# Patient Record
Sex: Female | Born: 1950 | Race: White | Hispanic: No | Marital: Married | Smoking: Never smoker
Health system: Southern US, Community
[De-identification: ages and names within clinical notes are randomized; demographics above are authoritative.]

## PROBLEM LIST (undated history)

## (undated) DIAGNOSIS — F329 Major depressive disorder, single episode, unspecified: Secondary | ICD-10-CM

## (undated) DIAGNOSIS — F32A Depression, unspecified: Secondary | ICD-10-CM

## (undated) DIAGNOSIS — I10 Essential (primary) hypertension: Secondary | ICD-10-CM

## (undated) DIAGNOSIS — N644 Mastodynia: Secondary | ICD-10-CM

## (undated) DIAGNOSIS — K5900 Constipation, unspecified: Secondary | ICD-10-CM

## (undated) DIAGNOSIS — Z1231 Encounter for screening mammogram for malignant neoplasm of breast: Secondary | ICD-10-CM

## (undated) DIAGNOSIS — M19079 Primary osteoarthritis, unspecified ankle and foot: Secondary | ICD-10-CM

## (undated) DIAGNOSIS — Z01818 Encounter for other preprocedural examination: Secondary | ICD-10-CM

## (undated) LAB — EKG 12-LEAD
Atrial Rate: 78 {beats}/min
P Axis: 62 degrees
P-R Interval: 164 ms
Q-T Interval: 392 ms
QRS Duration: 88 ms
QTc Calculation (Bazett): 446 ms
R Axis: 0 degrees
T Axis: 49 degrees
Ventricular Rate: 78 {beats}/min

---

## 2009-02-02 NOTE — Progress Notes (Signed)
1. Left Ankle Arthritis (716.97E)  XR ANKLE LEFT AP LATERAL AND OBLIQUE, PR DRAIN/INJECT INTERMEDIATE JOINT/BURSA, triamcinolone acetonide (KENALOG) 40 MG/ML injection, lidocaine (XYLOCAINE) 1 % (PF) SOLN injection       Note dictated

## 2009-02-10 NOTE — Progress Notes (Signed)
CHIEF COMPLAINT/DIAGNOSIS:  Left ankle pain/posttraumatic ankle arthritis.     HISTORY OF PRESENT ILLNESS:  This is a 58 year old white female, otherwise  fairly healthy and active, who has been complaining of left ankle pain for at  least 5-6 years.  She does not have any specific history of ankle fracture.   She has multiple sprains in the past and when she was young she was doing  skydiving and she sprained her left ankle multiple times.  She saw   Dr. Andrena Mews in the early 1990s where she had apparently surgery on her  posterior tibialis tendon where she remembered that she has a tendon  reattachment.  She had been doing fairly well until recently when she saw Dr.  Andrena Mews and he told her that she has an end-stage ankle arthritis and he  recommended ankle fusion when the pain gets worse.  She did receive cortisone  injection in the past by her rheumatologist, but she has not had cortisone  injections in a while.  She describes the pain as an achy pain that can be  sharp at times and mainly on the anterior and medial aspect of the left  ankle.  The pain increases with activity and decreases with rest.  She would  like to be more active and she is here today for evaluation and  recommendation.  She is the niece of Mr Doy Mince who I treated him for a  trimalleolar fracture and dislocation 2 months ago.     The Past Medical History was reviewed and initialized, and that should  include the Social History and Family History and Review of Systems, as well.    PHYSICAL EXAMINATION:  This is a very pleasant 58 year old white female who  presents today in no acute distress, who is awake, alert, and oriented.  The  patient is well dressed, nourished, and groomed.  The patient with normal  affect.  Height is 5 feet 10 and weight is 175.  Resting respiratory rate is  16.       Examination of gait showing the patient walks heel-to-toe with a nonantalgic  gait and no significant limp.  Examination of stance showing a  well-maintained  arch bilaterally.  There is a mild swelling of the left ankle  compared to the right side.  Examination of range of motion showing decreased  left ankle range of motion compared to the right side, but she still has a  decent range of motion.  She was able to dorsiflex to about 5 degrees past  neutral on the left side compared to about 10-15 on the right side.  She has  about 40 degrees of total arch of motion on the left side.  She has a  reasonable subtalar motion, equal bilaterally.  She has a good strength in 4  planes and she has intact sensation.  The ankle is stable to drawer test.      X-RAYS:  X-rays were taken in the office today, 3 views of the left ankle  standing, and that showed a bone-on-bone arthritis of the left ankle.  She  has a large anterior osteophyte that seems to be detached from the distal  tibia.  She also has another osteophyte on the posterior aspect dorsal to the  os trigonum.  No acute fractures can be seen.  She has some subchondral  changes that can be seen in the medial malleolus.       IMPRESSION:  The patient has a left ankle  end-stage ankle arthritis.  She  still has a reasonable range of motion.  She is symptomatic.       PLAN:  I had a long discussion with the patient and her husband about  treatment options about this patient.  She has not had any steroid injections  in a while.  I did recommend that the patient receive a cortisone injection,  which she agrees to have.     PROCEDURE:  Under sterile condition, the patient received a cortisone  injection in the form of Kenalog 1 mL of 40 mg mixed with 3 mL of 1%  lidocaine.  The patient tolerated the procedure well.       The patient did report significant improvement immediately after the  injection.     I did discuss with the patient at length about surgical options which  included ankle fusion versus replacement.  I did discuss with the patient the  pros and cons of each.  The patient will think about it and then she will  come  back for followup in 2 months.        [facility] Revonda Humphrey, MD     SMA /[id number]  DD: 02/02/2009 17:45  DT: 02/02/2009 18:11  SSI File#: [id number]  Job #: [id number]

## 2009-04-13 NOTE — Telephone Encounter (Signed)
Dr.Arebi please advise.

## 2009-04-13 NOTE — Telephone Encounter (Signed)
Pt wanting to discuss getting ankle replacement   Pt missed appt today but would just like to discuss her options for surg- pls call thx

## 2009-07-14 NOTE — Telephone Encounter (Signed)
Pt could not have ankle surg done in [place] because insurance wouldn't approve-so pt thought if came back here if Dr Su Hoff would see he-pls call her she has few questions-pls call

## 2009-07-14 NOTE — Telephone Encounter (Signed)
Called patient and told her that I will discuss her questions and concerns with Dr. Su Hoff in the office tomorrow.

## 2009-07-15 NOTE — Telephone Encounter (Signed)
Patient needs a letter saying that you have seen her in the office and that you think that she would benefit greatly from having ankle surgery.

## 2009-07-19 NOTE — Telephone Encounter (Signed)
Pt called to check on the letter and really wants it faxed.  I told her this may not be possible.  You may want to touch base with her on this matter.

## 2009-07-19 NOTE — Telephone Encounter (Signed)
I talked to her and we are going to see if they will use his last OV note.

## 2009-10-11 NOTE — Telephone Encounter (Signed)
Called and spoke with husband and he is calling back with a phone number for a person I need to call.

## 2009-10-11 NOTE — Telephone Encounter (Signed)
Chip Pangborn calling Husband of Rula Keniston.      [employer] Rep for [employer]. [phone]      Chip- [phone]

## 2009-10-11 NOTE — Telephone Encounter (Signed)
Pt's husband calling and would like to speak with Verlon Au regarding a specialist in [place] the pt was referred to -pls call

## 2009-10-12 NOTE — Telephone Encounter (Signed)
Dr. Su Hoff is going to speak with Dr. Izell Carolina about this.

## 2010-01-16 NOTE — Telephone Encounter (Signed)
fyi-per Italychad peterson (works w/ Dr. Izell Carolinaonti) if SMA has any questions can email. Pt states that SMA studied w/ Dr. Izell Carolinaonti and is having surgery w/ this MD, however she is follow up w/ SMA.

## 2010-01-17 NOTE — Telephone Encounter (Signed)
SMA notified and said ok

## 2010-02-08 NOTE — Progress Notes (Signed)
Dr. Su Hoff ordered a short-leg cast  to be applied to Brooke Anderson's left lower leg and foot .  I applied a short-leg cast cast to Brooke Anderson's left lower leg and foot.  I applied 3 rolls of under padding in a 50/50 fashion.  Guy Begin requested light blue color fiberglass.  I rolled 4 rolls of fiberglass in a 50/50 fashion.  Her left lower ankle is in a position of function per Dr. Su Hoff.  Brooke Anderson's nail beds are pink in color, the extremity is warm to the touch.  Capillary refill is less than 2 seconds.  Guy Begin instructed on proper care of cast.  Do not get wet, keep all items out of cast.  If cast is painful please make appointment to get checked.  Patient also briefed on circulation compromise.  If digits are cold, blue, and tingling patient must must seek care.  If after hours patient is to go to Emergency Room.  During office hours patient must come in to office. \\

## 2010-02-08 NOTE — Progress Notes (Signed)
1. Total left ankle replacement      Dr Izell Carolina, 01/25/2010   2. Left Ankle Arthritis  XR ANKLE LEFT AP LATERAL AND OBLIQUE     Note dictated

## 2010-02-10 NOTE — Progress Notes (Signed)
DIAGNOSIS:  Status post left total ankle replacement by Dr. Izell Carolina in  [place].  Date of surgery is Jan 25, 2010.       HISTORY OF PRESENT ILLNESS:  This is a 59 year old white female who I saw a  year ago for right ankle posttraumatic arthritis and she went to have a total  ankle replacement in [place] by Dr. Izell Carolina.  She is here today for a 2-week  followup.  She did stay for 1 week in [place] and she was seen  postoperatively in 1 week for a wound check.  At that time she was placed in  a cast and asked to put about 30 pounds of weight 2 to 3 times a day to keep  the tibial component in place.  At this point she denies any significant  pain.  No numbness or tingling sensation and she is in a cast.  She denies  any fever or chills.  Patient is otherwise fairly healthy and active.       PHYSICAL EXAMINATION:  This is a very pleasant 59 year old white female who  was seen today in no acute distress, who is awake, alert, and oriented.   Patient well dressed, nourished, and groomed.  Patient with normal affect.   Height is 5 feet 10 inches and weight 175 pounds.  The resting respiratory  rate is 18 and pulse is 82.       The patient came in nonweightbearing on the left lower extremity with  crutches and a cast.  The cast was removed.  She has an anterior incision for  the ankle replacement which is healing well. No signs of any erythema or  drainage.  Also the lateral incision is healing well as well.  No pain with  limited range of motion of the left ankle.  She has intact sensation  distally.       IMAGING:  X-rays were taken in the office today, nonweightbearing of the left  ankle, 3 views that showed the ankle replacement in place.  Minimal gap less  than 1 mm can be seen over the medial side of the tibia but overall the  alignment is good.       IMPRESSION:  Two weeks out from left total ankle replacement doing fairly  well.       PLAN:  Patient will be placed back in cast with still nonweightbearing  but  she will put about 30 pounds of weight 2 to 3 times a day to keep pressure on  the tibial component.  I did contact  Italy Peterson, PAC,  in [place], the  [place] of Dr. Izell Carolina and the plan is to keep her in a cast for 4 weeks and after  that we will start gradual weightbearing after 6 to 8 weeks in a boot.  She  will come back in 2 weeks.  At that time we will remove the cast with the new  x-rays.             [facility] Revonda Humphrey, MD     [phone]  DD: 02/10/2010 04:47  DT: 02/12/2010 17:48  SSI File#: [id number]  Job #: [id number]

## 2010-02-22 NOTE — Progress Notes (Signed)
DIAGNOSIS:  Status post left total ankle replacement by Dr. Izell Carolina in [place].      Date of surgery: 5/11/ 2011.       HISTORY OF PRESENT ILLNESS:  This is a 59 year old white female came in today for f/u of her left total ankle replacement. I saw a year ago for left ankle posttraumatic arthritis and she went to have a total ankle replacement in [facility] by Dr. Izell Carolina.  She is here today for a 4-week followup.  She did stay for 1 week in [place] and she was seen postoperatively in 1 week for a wound check.  At that time she was placed in a cast and asked to put about 30 pounds of weight 2 to 3 times a day to keep the tibial component in place.  At this point she denies any significant pain.  No numbness or tingling sensation and she is in a cast.  She denies any fever or chills.  Patient is otherwise fairly healthy and active.       PHYSICAL EXAMINATION:  This is a very pleasant 59 year old white female who was seen today in no acute distress, who is awake, alert, and oriented.  Patient well dressed, nourished, and groomed.  Patient with normal affect.  Height is 5 feet 10 inches and weight 175 pounds.  The resting respiratory rate is 18 and pulse is 82.       The patient came in nonweightbearing on the left lower extremity with crutches and a cast.  The cast was removed.  She has an anterior incision for the ankle replacement which is healing well. No signs of any erythema or drainage.  Also the lateral incision is healing well as well.  No pain with limited range of motion of the left ankle.  She has intact sensation distally.       IMAGING:  X-rays were taken in the office today, nonweightbearing of the left ankle, 3 views that showed the ankle replacement in place.  Minimal gap less than 1 mm can be seen over the medial side of the tibia but overall the alignment is good.       IMPRESSION:  Four weeks out from left total ankle replacement doing fairly well.       PLAN:  Patient will be placed back in cast  for 2 more weeks with still nonweightbearing but she will put about 30 pounds of weight 2 to 3 times a day to keep pressure on the tibial component. After that we will start gradual weightbearing in a boot for 6 to 8 weeks .  She  will come back in 2 weeks.  At that time we will remove the cast with the new x-rays.             Brooke Anderson Brooke Humphrey, MD

## 2010-03-08 NOTE — Progress Notes (Signed)
DIAGNOSIS:  Status post left total ankle replacement by Dr. Izell Carolina in [place].      Date of surgery: 5/11/ 2011.       HISTORY OF PRESENT ILLNESS:  This is a 59 year old white female came in today for f/u of her left total ankle replacement. I saw her a year ago for left ankle posttraumatic arthritis and she went to have a total ankle replacement in [facility] by Dr. Izell Carolina.  She is here today for a 6-week followup.  She did stay for 1 week in [place] where she was seen postoperatively in 1 week for a wound check.  At that time she was placed in a cast and asked to put about 30 pounds of weight 2 to 3 times a day to keep the tibial component in place.  At this point she denies any significant pain.  No numbness or tingling sensation and she is in a cast.  She denies any fever or chills.  Patient is otherwise fairly healthy and active.       PHYSICAL EXAMINATION:  This is a very pleasant 59 year old white female who was seen today in no acute distress, who is awake, alert, and oriented.  Patient well dressed, nourished, and groomed.  Patient with normal affect.  Height is 5 feet 10 inches and weight 175 pounds.  The resting respiratory rate is 18 and pulse is 82.       The patient came in nonweightbearing on the left lower extremity with crutches and a cast.  The cast was removed.  The anterior and the lateral incisions for the ankle replacement are healed well. No signs of any erythema or drainage. No pain with limited range of motion of the left ankle.  She has intact sensation distally. No tenderness.      IMAGING:  X-rays were taken in the office today, nonweightbearing of the left ankle, 3 views that showed the ankle replacement in place.  The syndesmosis fusion still visible at the fibula side.       IMPRESSION:  Six weeks out from left total ankle replacement doing fairly well.       PLAN:  Patient will be placed in a boot and start gradual weightbearing. I discussed with the patient that I think that she  would really benefit from a course of physical therapy for further strengthening and stretching. An Rx for physical therapy was given to the patient.  She will come back in 4 weeks.  At that time we will get new x-rays standing.             Chelsi Warr Revonda Humphrey, MD

## 2010-03-15 NOTE — Telephone Encounter (Signed)
Spoke with [facility] and told her to start gradual WB in boot and everything else is per patients pain tolerance.

## 2010-03-15 NOTE — Telephone Encounter (Signed)
Yelema calling from [facility] to find out what pt's limitations/precautions are since she had ankle replacement. Pls call

## 2010-03-17 NOTE — Telephone Encounter (Signed)
Spoke with patient and told her vitamin d would work and to avoid sunlight with that.

## 2010-03-17 NOTE — Telephone Encounter (Signed)
Pt wanting to know what is the name of the ointment that you can apply to the scare that helps it go away.pls call pt

## 2010-04-06 NOTE — Progress Notes (Signed)
DIAGNOSIS:  Status post left total ankle replacement by Dr. Izell Carolina in [place].      Date of surgery: 01/25/2010.       HISTORY OF PRESENT ILLNESS:  This is a 59 year old white female came in today for f/u of her left total ankle replacement. She is here today for a 10 weeks followup, and WBAT in a boot and also in PT with good progress. I saw her a year ago for left ankle posttraumatic arthritis and she went to have a total ankle replacement in [facility] by Dr. Izell Carolina. She did stay for 1 week in [place] where she was seen postoperatively in 1 week for a wound check.  At that time she was placed in a cast and asked to put about 30 pounds of weight 2 to 3 times a day to keep the tibial component in place.  At this point she denies any significant pain.  No numbness or tingling sensation and she is in a boot.  She denies any fever or chills.  Patient is otherwise fairly healthy and active.       PHYSICAL EXAMINATION:  This is a very pleasant 59 year old white female who was seen today in no acute distress, who is awake, alert, and oriented.  Patient well dressed, nourished, and groomed.  Patient with normal affect.  Height is 5 feet 10 inches and weight 175 pounds.  The resting respiratory rate is 18 and pulse is 82.       The patient came in nonweightbearing on the left lower extremity with crutches and a cast.  The cast was removed.  The anterior and the lateral incisions for the ankle replacement are healed well. No signs of any erythema or drainage. No pain with limited range of motion of the left ankle.  She has intact sensation distally. No tenderness.      IMAGING:  X-rays were taken in the office today, nonweightbearing of the left ankle, 3 views that showed the ankle replacement in place.  The syndesmosis fusion less visible at the fibula side.       IMPRESSION:  10 weeks out from left total ankle replacement doing fairly well.       PLAN:  Patient will be WBAT in boot for 3 more weeks, then out boot. I  discussed with the patient that I think that she would really benefit from more physical therapy for further strengthening, ROM and stretching.   She will come back in 4 weeks.  At that time we will get new x-rays standing.             Devondre Guzzetta Revonda Humphrey, MD

## 2010-04-06 NOTE — Progress Notes (Signed)
Patient presents today for recheck of her left total ankle repair.  Patient reports that she is doing much better, slowly increasing her ROM.  Patients pain level is 1/10.  Patient ROM is reported as good.

## 2010-05-03 NOTE — Progress Notes (Signed)
DIAGNOSIS:  Status post left total ankle replacement by Dr. Izell Carolina in [place].      Date of surgery: 01/25/2010.       HISTORY OF PRESENT ILLNESS:  This is a 59 year old white female came in today for f/u of her left total ankle replacement. She is here today for a 3 months followup, and WBAT in regular shoes and also in PT with good progress. She went to have a total ankle replacement in [place] by Dr. Izell Carolina and spent 1 week in [place] where she was seen postoperatively in 1 week for a wound check.  At that time she was placed in a cast and asked to put about 30 pounds of weight 2 to 3 times a day to keep the tibial component in place.  At this point she denies any significant pain.  No numbness or tingling sensation and she is in a boot.  She denies any fever or chills.  Patient is otherwise fairly healthy and active.      The patient's past medical history, medications, and review of systems was reviewed.        PHYSICAL EXAMINATION:  This is a very pleasant 59 year old white female who was seen today in no acute distress, who is awake, alert, and oriented.  Patient well dressed, nourished, and groomed.  Patient with normal affect.  Height is 5 feet 10 inches and weight 175 pounds.  The resting respiratory rate is 18 and pulse is 82.       The patient came in full weightbearing on the left lower extremity, with minimal limp. The anterior and the lateral incisions for the ankle replacement are healed well. No signs of any erythema or drainage. No pain with limited range of motion of the left ankle.  She has intact sensation distally. No tenderness. DF -8, PF 35     IMAGING:  X-rays were taken in the office today, standing of the left ankle, 3 views that showed the ankle replacement in place.  The syndesmosis fusion healing well, no hard ware faliure.     IMPRESSION:  3 months out from left total ankle replacement doing fairly well.       PLAN:  Patient will continue to be WBAT. I discussed with the patient  that I think that she would really benefit from more physical therapy for further strengthening, ROM and stretching.   She will come back in 2 months.  At that time we will get new x-rays standing.             Regnia Mathwig Revonda Humphrey, MD

## 2010-07-14 NOTE — Progress Notes (Signed)
DIAGNOSIS:  Status post left total ankle replacement by Dr. Izell Carolina in [place].      Date of surgery: 01/25/2010.       HISTORY OF PRESENT ILLNESS:  This is a 59 year old white female came in today for f/u of her left total ankle replacement. She is here today for a 6 months followup, and WBAT in regular shoes. She finished PT with good progress. She is very happy with the results and has no pain.  She went to have a total ankle replacement in [place] by Dr. Izell Carolina and spent 1 week in [place] where she was seen postoperatively in 1 week for a wound check.  At that time she was placed in a cast and asked to put about 30 pounds of weight 2 to 3 times a day to keep the tibial component in place.  At this point she denies any pain. No numbness or tingling sensation. She denies any fever or chills. Patient is otherwise fairly healthy and active.      The patient's past medical history, medications, and review of systems was reviewed.        PHYSICAL EXAMINATION:  This is a very pleasant 59 year old white female who was seen today in no acute distress, who is awake, alert, and oriented.  Patient well dressed, nourished, and groomed.  Patient with normal affect.  Height is 5 feet 10 inches and weight 175 pounds.  The resting respiratory rate is 18 and pulse is 82.       The patient came in full weightbearing on the left lower extremity, with no limp. The anterior and the lateral incisions for the ankle replacement are healed well. No signs of any erythema or drainage. No pain with good range of motion of the left ankle.  She has intact sensation distally. No tenderness. DF 5, PF 45     IMAGING:  X-rays were taken in the office today, standing of the left ankle, 3 views that showed the ankle replacement in place.  The syndesmosis fusion healing well, no hard ware faliure.     IMPRESSION:  6 months out from left total ankle replacement doing fairly well.       PLAN:  Patient will continue to be WBAT. I discussed with  the patient that I think that she would really benefit from more strengthening, ROM and stretching.   She will come back in 6 months.  At that time we will get new x-rays standing.             Conita Amenta Revonda Humphrey, MD

## 2011-08-10 ENCOUNTER — Encounter

## 2011-08-14 LAB — TSH
T4 Free: 1.11 ng/dl (ref 0.9–1.8)
TSH: 2.64 u[IU]/mL (ref 0.35–5.5)

## 2011-08-14 LAB — T3, FREE: T3, Free: 2.9 pg/ml (ref 2.3–4.2)

## 2011-08-14 LAB — T4, FREE: T4 Free: 1.11 ng/dl (ref 0.9–1.8)

## 2011-08-14 NOTE — Patient Instructions (Signed)
1. Do not eat or drink anything after 12 midnight prior to surgery. This includes no water, chewing gum mints, or ice chips. You may brush your teeth and gargle the day of surgery but DO NOT SWALLOW THE WATER.   2. Please see your family doctor/pediatrician for a history and physical and/or concerning medications. Bring any test results/reports from your physician's office. If you are under the care of a heart doctor or specialist please be aware that you may be asked to see him or her for clearance.   3. You may be asked to stop blood thinners such as Coumadin, Plavix, Fragmin, and Lovenox or Anti-inflammatories such as Aspirin, Ibuprofen, Advil, and Naproxen prior to your surgery. Please check with your doctor before stopping these or any other medications.   4. Do not smoke, and do not drink any alcoholic beverages 24 hours prior to surgery.    5. You MUST make arrangements for a responsible adult to take you home after your surgery. For your safety, you will not be allowed to leave alone or drive yourself home. Your surgery will be cancelled if you do not have a ride home.Also for your safety, it is strongly suggested someone stay with you the first 24 hrs after your surgery.   6. A parent/legal guardian must accompany a child scheduled for surgery and plan to stay at the hospital until the child is discharged.  Please do not bring other children with you.   7. For your comfort,please wear simple, loose fitting clothing to the hospital.  Please do not bring valuables (money, credit cards, checkbooks, etc.) Do not wear any makeup (including no eye makeup) or nail polish on your fingers or toes.   8. For your safety, please DO NOT wear any jewelry or piercings on day of surgery.  All body piercing jewelry must be removed.   9. If you have dentures, they will be removed before going to the OR; for your convenience we will provide you with a container.  If you wear contact lenses or glasses, they will be removed,  they will be removed, please bring a case for them.   10. If appicable,Please see your family doctor/pediatrician for a history & physical and/or concerning medications.  Bring any test results/reports from your physician's office.   11. Remember to bring Blood Bank bracelet to the hospital on the day of surgery.   12. If you have a Living Will and Durable Power of Attorney for Healthcare, please bring in a copy.   13. Notify your Surgeon if you develop any illness between now and surgery  time, cough, cold, fever, sore throat, nausea, vomiting, etc.  Please notify your surgeon if you experience dizziness, shortness of breath or blurred vision between now & the time of your surgery   14. DO NOT shave your operative site 96 hours prior to surgery. For face & neck surgery, men may use an electric razor 48 hours prior to surgery.   15. Shower the night before surgery with __X_Antibacterial soap ___Hibiclens   16. To provide excellent care visitors will be limited to one in the room at any given time.               17.  Please bring picture ID and insurance card.    18.  Visit our web site for additional information:  [email or web address].

## 2011-08-16 LAB — CBC WITH AUTO DIFFERENTIAL
Basophils %: 0.4 % (ref 0.0–2.0)
Basophils Absolute: 0 10*3 (ref 0.0–0.2)
Eosinophils %: 2.2 % (ref 0.0–5.0)
Eosinophils Absolute: 0.1 10*3 (ref 0.0–0.6)
Granulocyte Absolute Count: 4.6 10*3 (ref 1.7–7.7)
Hematocrit: 37.6 % (ref 36.0–48.0)
Hemoglobin: 12.6 gm/dl (ref 12.0–16.0)
Lymphocytes %: 18.1 % — ABNORMAL LOW (ref 25.0–40.0)
Lymphocytes Absolute: 1.2 10*3 (ref 1.0–5.1)
MCH: 30.3 pg (ref 26–34)
MCHC: 33.5 gm/dl (ref 31–36)
MCV: 90.6 fl (ref 80–100)
MPV: 7.5 fl (ref 5.0–10.5)
Monocytes %: 7.3 % (ref 0.0–12.0)
Monocytes Absolute: 0.5 10*3 (ref 0.0–1.3)
Platelets: 322 10*3 (ref 135–450)
RBC: 4.15 10*6 (ref 4.0–5.2)
RDW: 14.6 % — ABNORMAL HIGH (ref 11.5–14.5)
Segs Relative: 72 % — ABNORMAL HIGH (ref 42.0–63.0)
WBC: 6.4 10*3 (ref 4.0–11.0)

## 2011-08-16 LAB — BASIC METABOLIC PANEL
BUN: 22 mg/dl — ABNORMAL HIGH (ref 7–18)
CO2: 28 mEq/L (ref 21–32)
Calcium: 9.1 mg/dl (ref 8.3–10.6)
Chloride: 102 mEq/L (ref 99–110)
Creatinine: 0.9 mg/dl (ref 0.6–1.2)
GFR Est, African/Amer: >60
GFR, Estimated: >60 (ref >60)
Glucose: 78 mg/dl (ref 70–99)
Potassium: 4.1 mEq/L (ref 3.5–5.1)
Sodium: 138 mEq/L (ref 136–145)

## 2011-08-16 LAB — URINALYSIS
Bilirubin Urine: NEGATIVE
Glucose, Ur: NEGATIVE mg/dl
Ketones, Urine: NEGATIVE mg/dl
Leukocyte Esterase, Urine: NEGATIVE
Nitrite, Urine: NEGATIVE
Protein, UA: NEGATIVE mg/dl
Specific Gravity, UA: 1.02 (ref 1.005–1.030)
Urobilinogen, Urine: 0.2 EU/dl (ref <2.0)
pH, UA: 6 (ref 4.5–8.0)

## 2011-08-16 LAB — PROTIME-INR
INR: 0.89
Protime: 10.2 s

## 2011-08-16 LAB — TYPE AND SCREEN
Antibody Screen: NEGATIVE
Rh Factor: NEGATIVE

## 2011-08-16 LAB — APTT: aPTT: 29.5 s

## 2011-08-25 NOTE — Anesthesia Pre-Procedure Evaluation (Signed)
Guy Begin     Anesthesia Evaluation      No history of anesthetic complications   Airway   Mallampati: II  TM distance: >3 FB  Neck ROM: full  Dental      Pulmonary - negative ROS   Cardiovascular   (+) hypertension,     Neuro/Psych    (+) psychiatric history  GI/Hepatic/Renal - negative ROS     Endo/Other  (+) , arthritis  Abdominal        Other findings: Limited mouth opening           Allergies: Review of patient's allergies indicates no known allergies.    NPO Status:                              Anesthesia Plan    ASA 2     general   (I discussed inhalational and intravenous endotracheal anesthesia with ISNB patient to Her  satisfaction and agreed including risks and alternatives. No further questions.    Virdie Penning S Brannen Koppen  )  intravenous and inhalational induction   Anesthetic plan and risks discussed with patient.    Plan discussed with CRNA.          Petro Talent S Joshuajames Moehring  08/25/2011

## 2011-08-27 ENCOUNTER — Inpatient Hospital Stay
Admit: 2011-08-27 | Disposition: A | Discharge status: Home / Self Care | Source: Ambulatory Visit | Attending: Orthopaedic Surgery | Admitting: Orthopaedic Surgery

## 2011-08-27 MED ORDER — PROMETHAZINE HCL 25 MG/ML IJ SOLN
25 | INTRAMUSCULAR | Status: DC | PRN
Start: 2011-08-27 — End: 2011-08-27

## 2011-08-27 MED ORDER — HYDROMORPHONE HCL 2 MG/ML IJ SOLN
2 | INTRAMUSCULAR | Status: DC | PRN
Start: 2011-08-27 — End: 2011-08-27

## 2011-08-27 MED ORDER — MIDAZOLAM HCL 2 MG/2ML IJ SOLN
2 | Freq: Once | INTRAMUSCULAR | Status: AC
Start: 2011-08-27 — End: 2011-08-27

## 2011-08-27 MED ORDER — OXYCODONE-ACETAMINOPHEN 5-325 MG PO TABS
5-325 MG | ORAL_TABLET | ORAL | Status: AC | PRN
Start: 2011-08-27 — End: 2012-08-26

## 2011-08-27 MED ORDER — LIDOCAINE HCL (PF) 1 % IJ SOLN
1 | Freq: Once | INTRAMUSCULAR | Status: AC | PRN
Start: 2011-08-27 — End: 2011-08-27

## 2011-08-27 MED ORDER — ONDANSETRON HCL 4 MG/2ML IJ SOLN
4 | Freq: Four times a day (QID) | INTRAMUSCULAR | Status: DC | PRN
Start: 2011-08-27 — End: 2011-08-27

## 2011-08-27 MED ADMIN — ceFAZolin (ANCEF) 2 g in dextrose 5% 100 mL IVPB: INTRAVENOUS | @ 16:00:00 | NDC 09999990046

## 2011-08-27 MED ADMIN — buPROPion (WELLBUTRIN XL) XL tablet 300 mg: ORAL | @ 22:00:00 | NDC 67767014190

## 2011-08-27 MED ADMIN — celecoxib (CELEBREX) capsule 200 mg: ORAL | @ 15:00:00 | NDC 00025152034

## 2011-08-27 MED ADMIN — midazolam (VERSED) 2 MG/2ML injection: INTRAVENOUS | @ 16:00:00 | NDC 25021065502

## 2011-08-27 MED ADMIN — oxyCODONE (OXYCONTIN) CR tablet 10 mg: ORAL | @ 15:00:00 | NDC 59011041020

## 2011-08-27 MED ADMIN — lactated ringers infusion: INTRAVENOUS | @ 20:00:00 | NDC 00338011704

## 2011-08-27 MED ADMIN — lidocaine 1 % injection 1 mL: INTRADERMAL | @ 15:00:00 | NDC 63323049227

## 2011-08-27 MED ADMIN — pregabalin (LYRICA) capsule 75 mg: ORAL | @ 15:00:00 | NDC 00071101441

## 2011-08-27 MED ADMIN — venlafaxine (EFFEXOR-XR) XR capsule 75 mg: ORAL | @ 22:00:00 | NDC 00904624661

## 2011-08-27 MED ADMIN — lactated ringers infusion: INTRAVENOUS | @ 15:00:00 | NDC 00338011704

## 2011-08-27 MED FILL — THROMBIN-JMI 20000 UNITS EX KIT: 20000 units | CUTANEOUS | Qty: 1

## 2011-08-27 MED FILL — OXYCONTIN 10 MG PO TB12: 10 MG | ORAL | Qty: 1

## 2011-08-27 MED FILL — LIDOCAINE HCL (PF) 1 % IJ SOLN: 1 % | INTRAMUSCULAR | Qty: 2

## 2011-08-27 MED FILL — CEFAZOLIN 2000 MG IN D5W 100 ML IVPB: Qty: 2

## 2011-08-27 MED FILL — BUPROPION HCL ER (XL) 150 MG PO TB24: 150 MG | ORAL | Qty: 2

## 2011-08-27 MED FILL — MIDAZOLAM HCL 2 MG/2ML IJ SOLN: 2 MG/ML | INTRAMUSCULAR | Qty: 2

## 2011-08-27 MED FILL — LYRICA 75 MG PO CAPS: 75 MG | ORAL | Qty: 1

## 2011-08-27 MED FILL — CELEBREX 100 MG PO CAPS: 100 MG | ORAL | Qty: 2

## 2011-08-27 MED FILL — LACTATED RINGERS IV SOLN: INTRAVENOUS | Qty: 1000

## 2011-08-27 MED FILL — EFFEXOR XR 37.5 MG PO CP24: 37.5 MG | ORAL | Qty: 2

## 2011-08-27 NOTE — Anesthesia Post-Procedure Evaluation (Signed)
Postoperative Anesthesia Note    Name:    Brooke Anderson  MRN:      [id number]    Patient Vitals for the past 12 hrs:   BP Temp Temp src Pulse Resp SpO2 Height Weight   08/27/11 1532 114/73 mmHg 97.4 ??F (36.3 ??C) Oral 69  14  99 % - -   08/27/11 1500 124/66 mmHg 97.9 ??F (36.6 ??C) Temporal 71  13  97 % - -   08/27/11 1445 122/65 mmHg - - 70  14  97 % - -   08/27/11 1430 116/65 mmHg - - 75  15  96 % - -   08/27/11 1415 121/70 mmHg - - 71  12  97 % - -   08/27/11 1400 113/66 mmHg - - 76  12  96 % - -   08/27/11 1355 123/70 mmHg - - 72  10  96 % - -   08/27/11 1350 135/77 mmHg - - 71  10  97 % - -   08/27/11 1346 136/78 mmHg 97.2 ??F (36.2 ??C) Temporal 77  12  96 % - -   08/27/11 0937 117/60 mmHg 97.6 ??F (36.4 ??C) Temporal 89  12  96 % 5\' 10"  (1.778 m) 179 lb (81.194 kg)        LABS:    CBC  Lab Results   Component Value Date/Time    WBC 6.4 08/16/2011  9:20 AM    HGB 12.6 08/16/2011  9:20 AM    HCT 37.6 08/16/2011  9:20 AM    PLT 322 08/16/2011  9:20 AM     RENAL  Lab Results   Component Value Date/Time    NA 138 08/16/2011  9:20 AM    K 4.1 08/16/2011  9:20 AM    CL 102 08/16/2011  9:20 AM    CO2 28 08/16/2011  9:20 AM    BUN 22* 08/16/2011  9:20 AM    CREATININE 0.9 08/16/2011  9:20 AM    GLUCOSE 78 08/16/2011  9:20 AM     COAGS  Lab Results   Component Value Date/Time    PROTIME 10.2 08/16/2011  9:20 AM    INR 0.89 08/16/2011  9:20 AM    APTT 29.5 08/16/2011  9:20 AM       Intake & Output:  In: 2100 [I.V.:2100]  Out: 400     Nausea & Vomiting:  No    Level of Consciousness:  Awake    Pain Assessment:  Adequate analgesia    Anesthesia Complications:  No apparent anesthetic complications    SUMMARY      Vital signs stable  OK to discharge from Stage I post anesthesia care.

## 2011-08-27 NOTE — Progress Notes (Signed)
Admit to PACU  Report received.  Arouses, no complaints.  Moving right fingers.  HOB elevated.

## 2011-08-27 NOTE — Progress Notes (Signed)
Dozing off and on, arouses easily, no complaints.  Report given to B1 RN at bedside in PACU.  Skin assessment done.  Ready for transfer.

## 2011-08-27 NOTE — H&P (Signed)
I have examined the patient.  I have reviewed the history and physical and find no relevant changes.  I have reviewed with the patient and/or family the risks, benefits, and alternatives to the procedure.

## 2011-08-27 NOTE — Discharge Instructions (Signed)
Instructions Following Shoulder Replacement Surgery    The instructions below are guidelines to help you during the initial postoperative period. Contact our office if you have further questions or need clarification of this information.    Activity  -Minimize activity at home for the first 24-72 hours.  -Perform the exercises as instructed by the physical therapists and outlined on the instruction sheet that they provided to you.  -Use an ice pack for 30 minutes on and then 30 minutes off at least 8-10 times per day unless you have a cold therapy unit.  -Sitting up in a chair or recliner may be more comfortable than lying down. This includes sleeping.    Wound Care  -Leave dressing on until your first office visit.   -Before you leave the hospital, you will have a waterproof dressing.  You may shower if this dressing is on and sealed at the edges.                     -No baths or whirlpools until 10-14 days after the surgery.    Cold Therapy unit  -Not all patients have this unit.  This machine helps to reduce pain and swelling after more extensive shoulder surgery.  -Use the machine as much as possible for the first week.  -Change the ice every 6-8 hours to keep the temperature around 42 degrees  -Never place the pad directly on the skin.    Prescriptions  -You will be given a prescriptions for a narcotic (Percocet, Vicodin etc.).   -You may be given a prescription for an anti-inflammatory (Naprosyn or Motrin).  -Please fill them at the pharmacy and take them as prescribed.  -Narcotic prescriptions will not be refilled/replaced after hours or on the weekends.  -If an antibiotic has been prescribed, always take all of the antibiotics.    Follow-Up  -You will be seen in the office 7-14 days after the surgery.  -Please call [phone] to schedule an appointment if one has not already been scheduled.

## 2011-08-27 NOTE — H&P (Signed)
I have examined the patient.  I have reviewed the history and physical and find no relevant changes.  I have reviewed with the patient and/or family the risks, benefits, and alternatives to the procedure. &

## 2011-08-27 NOTE — Anesthesia Pre-Procedure Evaluation (Signed)
Brooke Anderson   10-Sep-1951  60 y.o.     INTERSCALENE NERVE BLOCK NOTE    Pt agrees to risks, benefits and alternatives of block.  Surgical procedure: TSR  Side: right  Requested per Dr. Marilynne Drivers for post-op pain relief  Time-out done with :  CRNA    Pt sedated with Versed 2mg                             Fentanyl 0ug      Propofol 0  Monitor on  Supine  Prepped with chlorhexidine     Yes-Lidocaine 1% used for topical local anesthetic     Yes-  24 gauge 25 mm Stimuplex needle used     No- 22 gauge 50 mm Stimuplex needle used    Initial stimulation of 1.5 ma at 2 HZ decreased to 0.5 ma before loss of stimulation.  Good stimulation of shoulder/arm.Bupivacaine .50% 30cc + lido 2%, 10 ml + decadron 6 mg injected in 5 cc increments after negative aspiration each time.  Yes- Epinephrine 1:200,000 included  Pt tolerated procedure well.  No complications.  Comments:       Jamyah Folk S Macklin Jacquin

## 2011-08-27 NOTE — Op Note (Signed)
Brooke Anderson(1951-02-20)    Date of Surgery- 08/27/2011    Preoperative Diagnosis-   1.  Glenohumeral arthritis right shoulder     Postoperative Diagnosis-  1.  Glenohumeral arthritis right shoulder    Procedure-   1.  Total right shoulder arthroplasty ([id number])            2.  Biceps Tenodesis ([id number])    Surgeon-  Hyman Hopes. Chrles Selley, MD    Assistants- G. Benecki, MD    Anesthesia:  General + Interscalene block    Estimated blood loss:      Drains- Medium Hemovac    Implants- Depuy Global AP    Humeral component- 12 mm press fit    Humeral Head- 52 x 21 eccentric    Glenoid component-  48    Cement- 1 batch    Surgical Indications  The patient had persistent pain and limitations of motion.  Preoperative physical exam and imaging studies confirmed the aforementioned diagnosis.  Personal review of the CT scan confirmed less than 10 degrees of glenoid retroversionThe patient chose to proceed with total shoulder arthroplasty and biceps tenodesis.  Risks, benefits, expected outcomes and potential complications were discussed.  At no time were any guarantees implied or stated.  The patient electively signed the consent form.    Patient Positioning and Surgical Prep  The patient was seen in the holding area and the appropriate extremity marked with an indelible pen.  The anesthesia department administered an interscalene block.  The patient was taken to the operative suite, identified and transferred to the OR table with the attached T-Max shoulder table.  General anesthesia was administered.  The patient was placed in the semi beach chair position with the head of the bed elevated to 30 degrees.    The shoulder was isolated with a U-drape  The upper extremity was prepped from the neck to the fingertips with Chloraprep.  The shoulder was draped in the normal sterile fashion.  Collier Flowers was secured to the exposed skin to isolate the axilla.  The forearm was secured in the Spider limb positioning  device.    Exposure  A standard deltopectoral approach was used.  An incision was made from the coracoid to the axillary fold.  The cephalic vein was identified, protected and mobilized laterally.  Deep self-retaining retractors were positioned.  The bicipital groove was identified, opened  and the biceps tendon tenodesed distally to the pectoralis major tendon.  The proximal portion of the biceps tendon within the bicipital groove was then excised.  A lesser tuberosity osteotomy was performed with an oscillating saw. The subscapularis tendon was separated from the capsule.  The capsule was divided from its insertion on the lesser tuberosity.  With the axillary nerve well protected, a circumferential release of the subscapularis tendon was completed.  Hypertrophic synovium and anterior capsule were excised.  The extra-articular portion of the biceps was excised after securing the distal portion of the tendon to the pectoralis major tendon.    A spiked glenoid retractor was positioned to retract the subscapularis tendon.  A humeral head retractor was placed and the inferior capsule well visualized.  Using scissors under direct visualization, the capsule was divided to the 6:00 position of the glenoid.    Humeral Head Resection  The arm was externally rotated, extended and delivered through the incision.  Retractors were positioned to protect the soft tissue.  Humeral osteophytes were removed with an osteotome and rongeur.      Freehand  Technique  An oscillating power saw was used to resect the anatomic portion of the humeral head.  The cut exited within the bare area posteriorly and at the articular margin of the supraspinatus superiorly.    Humerus Preparation  With the proximal humerus well exposed, a pilot hole was created at the most superior aspect of the humerus in line with its long axis. The humerus was sequentially prepared using hand reamers until cortical contact was achieved.  The appropriate size body  sizing osteotome was inserted to remove lateral bone for the broach.  Broaches were seated until the correct size broach collar sat flushly on the cancellous bone.  A broach was left in place to protect the humerus during glenoid preparation.     Glenoid Preparation  HA humeral head and glenoid retractors were positioned to expose the glenoid.  Soft tissue including labrum and some capsule were excised.  The biceps tendon was divided and the intraarticular portion excised.  A sizing disc was used to determine the correct glenoid size, and a mark made in the center of the glenoid.   With the proximal humerus displaced posteriorly, A guide pin was drilled into the glenoid and its exit medially confirmed with digital palpation. The appropriate size cannulated glenoid reamer was placed over the guide pin and the glenoid reamed slightly eccentrically to remove articular cartilage and create a smooth, concentric glenoid surface.  The central drill guide was placed in the glenoid fossa and a drill bit used to create a hole.     Anchor Peg Glenoid  The gold central guide was placed to enlarge the previously created hole.  The 3 hole peripheral guide with post was inserted into the central hole.  The peripheral holes were drilled.  The anchor peg trial was inserted and sat flushly on the glenoid.  The peripheral holed were irrigated and debris removed.  Bone graft was applied to the central peg of the final component.  Cement was placed in the peripheral holes and the final component cemented into place.    Final Component Placement and Reduction  Glenoid retractors were removed and the humeral head carefully delivered anteriorly taking care not to damage the glenoid component.      Fixed Neck Trial  The humeral osteotomy was evaluated with the calcar guide.  The angle of the calcar reamer nicely approximated the osteotomy.   The appropriate size humeral head was placed to best cover the osteotomy site.  The head was  impacted into place using the Celcon humeral head impactor    Press fit  Four #2 Orthocord sutures were place through drill holes in the proximal humerus to re-approximate the lesser tuberosity osteotomy.  The final component was placed and the sutures positioned around the component.  The shoulder was taken through a range of motion. There was approximately 50% posterior translation.      Closure and Disposition  The subscapularis tendon and lesser tuberosity fragment were anatomically reattached with racking half hitch suture configuration using the previously placed #2 Orthocord sutures.  One suture was placed around the neck of the stem and through the tendon/bone junction. The lateral portion of the rotator interval was closed with 0 braided polyester.  The biceps tendon was inspected to confirm that the tenodesis was intact.  The axillary nerve was checked and noted to be intact.  The deltopectoral interval was closed with 0 Vicryl suture.  The subcutaneous tissue closed and in layers and the skin closed with a  running, subcuticular 4-0 Monocryl. Sterile dressings and an Ultrasling were applied.

## 2011-08-27 NOTE — Progress Notes (Signed)
Medicated with versed 2mg  ivp as ordered per dr. Koleen Nimrod.

## 2011-08-28 MED ADMIN — pregabalin (LYRICA) capsule 75 mg: ORAL | @ 14:00:00 | NDC 00071101441

## 2011-08-28 MED ADMIN — venlafaxine (EFFEXOR-XR) XR capsule 75 mg: ORAL | @ 14:00:00 | NDC 00904624661

## 2011-08-28 MED ADMIN — oxyCODONE (OXYCONTIN) CR tablet 20 mg: ORAL | @ 04:00:00 | NDC 59011042020

## 2011-08-28 MED ADMIN — docusate sodium (COLACE) capsule 100 mg: ORAL | @ 04:00:00 | NDC 00904224461

## 2011-08-28 MED ADMIN — oxyCODONE (OXYCONTIN) CR tablet 20 mg: ORAL | @ 14:00:00 | NDC 59011042020

## 2011-08-28 MED ADMIN — docusate sodium (COLACE) capsule 100 mg: ORAL | @ 14:00:00 | NDC 00904224461

## 2011-08-28 MED ADMIN — ceFAZolin (ANCEF) 2 g in dextrose 5% 100 mL IVPB: INTRAVENOUS | @ 01:00:00 | NDC 09999990046

## 2011-08-28 MED ADMIN — pregabalin (LYRICA) capsule 75 mg: ORAL | @ 04:00:00 | NDC 00071101441

## 2011-08-28 MED ADMIN — ceFAZolin (ANCEF) 2 g in dextrose 5% 100 mL IVPB: INTRAVENOUS | @ 08:00:00 | NDC 09999990046

## 2011-08-28 MED ADMIN — celecoxib (CELEBREX) capsule 200 mg: ORAL | @ 04:00:00 | NDC 00025152034

## 2011-08-28 MED ADMIN — celecoxib (CELEBREX) capsule 200 mg: ORAL | @ 14:00:00 | NDC 00025152034

## 2011-08-28 MED ADMIN — buPROPion (WELLBUTRIN XL) XL tablet 300 mg: ORAL | @ 14:00:00 | NDC 67767014190

## 2011-08-28 MED ADMIN — 0.9 % sodium chloride infusion: INTRAVENOUS | @ 08:00:00 | NDC 00338004904

## 2011-08-28 MED ADMIN — oxyCODONE-acetaminophen (PERCOCET) 5-325 MG per tablet 1 tablet: 1 | ORAL | @ 17:00:00 | NDC 00406051262

## 2011-08-28 MED FILL — DEXAMETHASONE SODIUM PHOSPHATE 4 MG/ML IJ SOLN: 4 MG/ML | INTRAMUSCULAR | Qty: 5

## 2011-08-28 MED FILL — ONDANSETRON HCL 4 MG/2ML IJ SOLN: 4 MG/2ML | INTRAMUSCULAR | Qty: 2

## 2011-08-28 MED FILL — LYRICA 75 MG PO CAPS: 75 MG | ORAL | Qty: 1

## 2011-08-28 MED FILL — DOK 100 MG PO CAPS: 100 MG | ORAL | Qty: 1

## 2011-08-28 MED FILL — ZEMURON 50 MG/5ML IV SOLN: 50 MG/5ML | INTRAVENOUS | Qty: 10

## 2011-08-28 MED FILL — EFFEXOR XR 37.5 MG PO CP24: 37.5 MG | ORAL | Qty: 2

## 2011-08-28 MED FILL — SODIUM CHLORIDE 0.9 % IJ SOLN: 0.9 % | INTRAMUSCULAR | Qty: 20

## 2011-08-28 MED FILL — MIDAZOLAM HCL 2 MG/2ML IJ SOLN: 2 MG/ML | INTRAMUSCULAR | Qty: 2

## 2011-08-28 MED FILL — EPHEDRINE SULFATE 50 MG/ML IJ SOLN: 50 MG/ML | INTRAMUSCULAR | Qty: 1

## 2011-08-28 MED FILL — PHENYLEPHRINE HCL (PRESSORS) 10 MG/ML IJ SOLN: 10 MG/ML | INTRAMUSCULAR | Qty: 1

## 2011-08-28 MED FILL — LIDOCAINE HCL 2 % IJ SOLN: 2 % | INTRAMUSCULAR | Qty: 20

## 2011-08-28 MED FILL — CELEBREX 100 MG PO CAPS: 100 MG | ORAL | Qty: 2

## 2011-08-28 MED FILL — FENTANYL CITRATE 0.05 MG/ML IJ SOLN: 0.05 MG/ML | INTRAMUSCULAR | Qty: 5

## 2011-08-28 MED FILL — ROXICET 5-325 MG PO TABS: 5-325 MG | ORAL | Qty: 1

## 2011-08-28 MED FILL — PROPOFOL 10 MG/ML IV EMUL: 10 MG/ML | INTRAVENOUS | Qty: 20

## 2011-08-28 MED FILL — OXYCONTIN 20 MG PO TB12: 20 MG | ORAL | Qty: 1

## 2011-08-28 MED FILL — NEOSTIGMINE METHYLSULFATE 1 MG/ML IJ SOLN: 1 MG/ML | INTRAMUSCULAR | Qty: 10

## 2011-08-28 MED FILL — SODIUM CHLORIDE 0.9 % IV SOLN: 0.9 % | INTRAVENOUS | Qty: 1000

## 2011-08-28 MED FILL — GLYCOPYRROLATE 0.2 MG/ML IJ SOLN: 0.2 MG/ML | INTRAMUSCULAR | Qty: 5

## 2011-08-28 MED FILL — BUPROPION HCL ER (XL) 150 MG PO TB24: 150 MG | ORAL | Qty: 2

## 2011-08-28 NOTE — Progress Notes (Signed)
Physical Therapy    Initial Assessment    Date: 08/28/2011  Patient Name: Brooke Anderson  MRN: [id number]    DOB: 1950-12-10    Treatment Diagnosis: decreased ROM  RUE    Restrictions  Restrictions/Precautions: Surgical Protocols;ROM Restrictions (passive flexion <120,abd<75, neutral rotation)  Required Braces or Orthoses?:  (sling with bolster)  Vision/Hearing  Vision  Vision: Within Functional Limits (glasses)  Hearing  Hearing: Within functional limits     Subjective  General  Chart Reviewed: Yes  Patient assessed for rehabilitation services?: Yes  Family / Caregiver Present: No  Referring Practitioner: Favorito  Referral Date : 08/28/11  Diagnosis: R TSA wtih bicep tenodesis  Follows Commands: Within Functional Limits  Subjective: ready for therapy  Pain Level: 3  Pain Type: Surgical pain  Pain Location: Shoulder  Pain Orientation: Right  Pain Descriptors: Discomfort  Patient's Stated Pain Goal: 3  Pain Intervention(s): Repositioned;Cold applied;Ambulation/Increased activity  Pre Treatment Pain Screening  Intervention List: Patient able Anderson continue with treatment    Orientation  Overall Orientation Status: Within Normal Limits  Home Living  Lives With: Spouse  Type of Home: House  Home Layout: Two level  Objective     Prior Function  Ambulation Assistance: Independent    RLE AROM: WFL  LLE AROM: WFL  Strength RLE: WFL  Strength LLE: WFL   Bed Mobility  Sit Anderson Supine: Independent  Transfers  Sit Anderson Stand: Independent  Bed Anderson Chair: Independent  Ambulation:  independent without device 200 ft   Exercises  Codmans 10 x     Scapular squeeze 10 x  A/AAROM right elbow, forearm, wrist and hand 10 x each  PROM right shoulder within precautions 10 x each     Assessment   Assessment: Decreased ROM  Treatment Diagnosis: decreased ROM  RUE  Specific instructions for Next Treatment: per protocol  Prognosis: Good  Discharge Recommendations: Home with assist PRN  Requires PT Follow Up: Yes  Time In: 0857  Time Out: 0935  Timed  Code Treatment Minutes: 34 Minutes  Total Treatment Time: 38   Activity Tolerance: Patient Tolerated treatment well       Plan   Times per week: until discharge  Times per day: Twice a day  Current Treatment Recommendations: ROM;Safety Education & Training  Patient Education: Role of PT, shoulder precautions, HEP, use of sling, donning of shirt  Safety Devices in place: Yes  Type of devices: Call light within reach;Left in bed;Nurse notified    Goals  Time Frame for Short term goals: 1-2 visits  Short term goal 1: Instruct in HEP, use of sling, shoulder precautions per MD     Reva Bores  License and Documentation Cosign  Therapy License Number: [phone]

## 2011-08-28 NOTE — Progress Notes (Signed)
Orthopedic Surgery Progress Note  LEATHIE WEICH  08/28/2011    Subjective:     Post-Operative Day # 1 Status Post right TSA     Systemic or Specific Complaints:No Complaints    Objective:     BP 106/66   Pulse 71   Temp(Src) 98.1 ??F (36.7 ??C) (Oral)   Resp 16   Ht 5\' 10"  (1.778 m)   Wt 179 lb (81.194 kg)   BMI 25.68 kg/m2   SpO2 93%    Intake/Output Summary (Last 24 hours) at 08/28/11 0732  Last data filed at 08/28/11 0443   Gross per 24 hour   Intake   2100 ml   Output   2835 ml   Net   -735 ml     DRAIN/TUBE OUTPUT:  Closed/Suction Drain Right;Medial Shoulder 10 French-Output (ml): 0 ml      General: alert, appears stated age and cooperative   Wound: Wound clean and dry no evidence of infection.   Extremity: Sling on extremity.  Distal NVI   DVT Exam: No evidence of DVT seen on physical exam.     Data Review  CBC: Lab Results   Component Value Date    WBC 6.4 08/16/2011    RBC 4.15 08/16/2011    HGB 12.6 08/16/2011    HCT 37.6 08/16/2011    PLT 322 08/16/2011       Assessment:     Status Post right TSA, doing well     Plan:      1: Continues current post-op course :  2:  Continue Pain Control  3:  Physical therapy as per TSA protocol  4:  Narcotic prescription in chart  5:  Anticipate discharge today if pain well controlled    River Ambrosio JOSEPH Latesha Chesney

## 2011-08-28 NOTE — Plan of Care (Signed)
Problem: PAIN  Goal: Patient's pain/discomfort is manageable  Intervention: ASSESS PAIN LEVEL  Pt rates pain using 0-10 scale. Pain meds administered as ordered. Pt encouraged to call for increasing pain or ineffective pain management. Pt verbalizes understanding. Call light within reach, will continue to monitor.

## 2011-08-28 NOTE — Discharge Summary (Signed)
Department of Orthopedic Surgery  Physician Assistant   Discharge Summary    The CHARENE MCCALLISTER is a 60 y.o. female underwent  total shoulder replacement procedure without complication.  SIRINITY OUTLAND was admitted to the floor following Her recovery in the PACU.     Discharge Diagnosis   Shoulder Replacement    Current Inpatient Medications    Current facility-administered medications:celecoxib (CELEBREX) capsule 200 mg, 200 mg, Oral, BID  oxyCODONE (OXYCONTIN) CR tablet 20 mg, 20 mg, Oral, Q12H SCH  pregabalin (LYRICA) capsule 75 mg, 75 mg, Oral, BID  acetaminophen (TYLENOL) tablet 650 mg, 650 mg, Oral, Q4H PRN  acetaminophen (TYLENOL) suppository 650 mg, 650 mg, Rectal, Q4H PRN  oxyCODONE-acetaminophen (PERCOCET) 5-325 MG per tablet 1 tablet, 1 tablet, Oral, Q6H PRN  HYDROmorphone (DILAUDID) injection 0.5 mg, 0.5 mg, Intravenous, Q3H PRN  oxyCODONE-acetaminophen (PERCOCET) 5-325 MG per tablet 2 tablet, 2 tablet, Oral, Q4H PRN  docusate sodium (COLACE) capsule 100 mg, 100 mg, Oral, BID  magnesium hydroxide (MILK OF MAGNESIA) 400 MG/5ML suspension 30 mL, 30 mL, Oral, Daily PRN  ondansetron (ZOFRAN) injection 4 mg, 4 mg, Intravenous, Q6H PRN  0.9 % sodium chloride infusion, , Intravenous, Continuous  venlafaxine (EFFEXOR-XR) XR capsule 75 mg, 75 mg, Oral, Daily  lisinopril (PRINIVIL;ZESTRIL) tablet 20 mg, 20 mg, Oral, Daily  buPROPion (WELLBUTRIN XL) XL tablet 300 mg, 300 mg, Oral, QAM    Post-operatively the patient???s diet was advanced as tolerated and their incision was checked POD #1.  The incision is dressing in place, clean, dry and intact with no signs of infection.  The patient remained neurovascularly intact in the upper extremity and had intact pulses distally.  Patient???s calf and upper arm remained soft and showed no evidence of DVT.  The patient was able to move their wrist and hand without any problems post-operatively.  Physical therapy and occupational therapy were consulted and began working with  the patient post-operatively.  The patient progressed with PT/OT as would be expected and continued to improve through their stay.  The patients pain was initially controlled with IV medications but we were able to transition to oral pain medications soon after arrival to the floor and their pain remained under good control through their hospital stay.  From a medical standpoint the patient remained stable and continued to have the medicine team follow throughout their stay.  The patients dressing was changed/incison was checked on day of d/c.  The patient will be discharged at this time to Home  with their current diet restrictions and will continue to follow the total shoulder precautions outlined to them by [place] and PT/OT.  They will remain in the sling until cleared by [place] at follow up.     Condition on Discharge: Stable    Plan  Return visit in 1 week..  Patient was instructed on the use of pain medications, the signs and symptoms of infection, and was given our number to call should they have any questions or concerns following discharge.

## 2011-08-28 NOTE — Plan of Care (Signed)
Problem: Musculor/Skeletal Functional Status  Intervention: PT Evaluation/treatment  Return to baseline function

## 2011-08-28 NOTE — Progress Notes (Signed)
Pt. D/C'd per Dr's orders.  Pt. Given D/C instructions, home med list, and script for Percocet.  Pt. Escorted out to family vehicle by RN.

## 2011-10-24 NOTE — Progress Notes (Signed)
DIAGNOSIS:  Status post left total ankle replacement by Dr. Izell Anderson in [place].      Date of surgery: 01/25/2010.       HISTORY OF PRESENT ILLNESS:  This is a 61 y.o. white female came in today for f/u of her left total ankle replacement. She is here today for a 20 months followup, and WBAT in regular shoes. She finished PT with good progress. She is c/o mild pain and swelling after she walked a lot during super ball party.    She went to have a total ankle replacement in [place] by Dr. Izell Anderson and spent 1 week in [place] where she was seen postoperatively in 1 week for a wound check.  At that time she was placed in a cast and asked to put about 30 pounds of weight 2 to 3 times a day to keep the tibial component in place.  At this point she denies any pain. No numbness or tingling sensation. She denies any fever or chills. Patient is otherwise fairly healthy and active.      The patient's past medical history, medications, and review of systems was reviewed.        PHYSICAL EXAMINATION:  Brooke Anderson is a very pleasant 61 y.o. Caucasian female who presents today in no acute distress, awake, alert, and oriented.  She is well dressed, nourished and  groomed.  Patient with normal affect.  Height is  5\' 10"  (1.778 m), weight is 178 lb (80.74 kg).  Resting respiratory rate is 16, and pulse is 82.       The patient came in full weightbearing on the left lower extremity, with no significant limp. The anterior and the lateral incisions for the ankle replacement are healed well. No signs of any erythema or drainage. No pain with good range of motion of the left ankle.  She has intact sensation distally. Minimal tenderness medially and over the syndesmosis. DF 5, PF 45.     IMAGING:  X-rays were taken in the office today, standing of the left ankle, 3 views that showed the ankle replacement in place.  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     IMPRESSION:  20 months out from left total ankle replacement doing fairly well, syndesmosis non-union and osteolysis distal fibula.       PLAN:  Patient will continue to be WBAT, avoid heavy impact activities. I discussed with the patient that I think that she would may need revision of the syndesmosis nonunion with bone graft of the osteolysis.   She will come back in 6 weeks.  At that time we will get new x-rays standing.             Brooke Jeanpaul Revonda Humphrey, MD

## 2011-11-08 NOTE — Communication Body (Signed)
DIAGNOSIS:  Status post left total ankle replacement by Dr. Izell Carolinaonti in [place].      Date of surgery: 01/25/2010.       HISTORY OF PRESENT ILLNESS:  This is a 61 y.o. white female came in today for f/u of her left total ankle replacement. She is here today for a 20 months followup, and WBAT in regular shoes. She finished PT with good progress. She is c/o mild pain and swelling after she walked a lot during super ball party.    She went to have a total ankle replacement in [place] by Dr. Izell Carolinaonti and spent 1 week in [place] where she was seen postoperatively in 1 week for a wound check.  At that time she was placed in a cast and asked to put about 30 pounds of weight 2 to 3 times a day to keep the tibial component in place.  At this point she denies any pain. No numbness or tingling sensation. She denies any fever or chills. Patient is otherwise fairly healthy and active.      The patient's past medical history, medications, and review of systems was reviewed.        PHYSICAL EXAMINATION:  Brooke Anderson is a very pleasant 61 y.o. Caucasian female who presents today in no acute distress, awake, alert, and oriented.  She is well dressed, nourished and  groomed.  Patient with normal affect.  Height is  5\' 10"  (1.778 m), weight is 178 lb (80.74 kg).  Resting respiratory rate is 16, and pulse is 82.       The patient came in full weightbearing on the left lower extremity, with no significant limp. The anterior and the lateral incisions for the ankle replacement are healed well. No signs of any erythema or drainage. No pain with good range of motion of the left ankle.  She has intact sensation distally. Minimal tenderness medially and over the syndesmosis. DF 5, PF 45.     IMAGING:  X-rays were taken in the office today, standing of the left ankle, 3 views that showed the ankle replacement in place.  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     IMPRESSION:  20 months out from left total ankle replacement doing fairly well, syndesmosis non-union and osteolysis distal fibula.       PLAN:  Patient will continue to be WBAT, avoid heavy impact activities. I discussed with the patient that I think that she would may need revision of the syndesmosis nonunion with bone graft of the osteolysis.   She will come back in 6 weeks.  At that time we will get new x-rays standing.             Brooke Revonda HumphreyM AREBI, MD

## 2011-12-05 NOTE — Progress Notes (Signed)
DIAGNOSIS:  Status post left total ankle replacement by Dr. Izell Carolina in [place].      Date of surgery: 01/25/2010.       HISTORY OF PRESENT ILLNESS:  This is a 61 y.o. white female came in today for f/u of her left total ankle replacement and non union of the syndesmosis. She is here today for f/u and planning on surgical repair, and she is WBAT in regular shoes. She finished PT with good progress. She started c/o mild pain and swelling after she walked a lot during super ball party.    She went to have a total ankle replacement in [place] by Dr. Izell Carolina and spent 1 week in [place] where she was seen postoperatively in 1 week for a wound check.  At that time she was placed in a cast and asked to put about 30 pounds of weight 2 to 3 times a day to keep the tibial component in place.  At this point she denies any pain. No numbness or tingling sensation. She denies any fever or chills. Patient is otherwise fairly healthy and active.      The patient's past medical history, medications, and review of systems was reviewed.        PHYSICAL EXAMINATION:  Ms. Lanpher is a very pleasant 61 y.o. Caucasian female who presents today in no acute distress, awake, alert, and oriented.  She is well dressed, nourished and  groomed.  Patient with normal affect.  Height is  5\' 10"  (1.778 m), weight is 180 lb (81.647 kg).  Resting respiratory rate is 16, and pulse is 82.       The patient came in full weightbearing on the left lower extremity, with no significant limp. The anterior and the lateral incisions for the ankle replacement are healed well. No signs of any erythema or drainage. No pain with good range of motion of the left ankle.  She has intact sensation distally. Minimal tenderness medially and over the syndesmosis. DF 5, PF 45.     IMAGING:  X-rays were taken in the office today, standing of the left ankle, 3 views that showed the ankle replacement in place.  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     IMPRESSION:  20 months out from left total ankle replacement doing fairly well, syndesmosis non-union and osteolysis distal fibula.       PLAN:  Patient will continue to be WBAT, avoid heavy impact activities. I discussed with the patient that I think that she would need revision of the syndesmosis nonunion with bone graft.    I did send xray to Dr Izell Carolina, and he in agreement that she needs a revision of her TAA syndesmosis non union.    I discussed the risks and benefits of surgery with the patient, including but not limited to infection, bleeding, pain, injury to nerves or blood vessels failure of the surgery and need for additional surgery.  All the patient's questions were answered.   We discussed an expected post-operative course.  She  is understanding of this and wishes to proceed.              Dovid Bartko Revonda Humphrey, MD

## 2011-12-06 NOTE — Telephone Encounter (Signed)
Pt was scheduled for 4-8, but forgot she has to move her father.  She r/s for 4-15.  She wanted you to know why she rescheduled.

## 2011-12-11 NOTE — Telephone Encounter (Signed)
David from [place] would like to know more about this case.  He is trying to get equipment ready.  [phone] is cell#.

## 2011-12-31 LAB — BASIC METABOLIC PANEL
BUN: 19 mg/dl — ABNORMAL HIGH (ref 7–18)
CO2: 28 mEq/L (ref 21–32)
Calcium: 9.2 mg/dl (ref 8.3–10.6)
Chloride: 104 mEq/L (ref 99–110)
Creatinine: 0.9 mg/dl (ref 0.6–1.2)
GFR Est, African/Amer: >60
GFR, Estimated: >60 (ref >60)
Glucose: 99 mg/dl (ref 70–99)
Potassium: 4.1 mEq/L (ref 3.5–5.1)
Sodium: 140 mEq/L (ref 136–145)

## 2011-12-31 LAB — CBC
Hematocrit: 35.8 % — ABNORMAL LOW (ref 36.0–48.0)
Hemoglobin: 12 gm/dl (ref 12.0–16.0)
MCH: 29 pg (ref 26–34)
MCHC: 33.5 gm/dl (ref 31–36)
MCV: 86.6 fl (ref 80–100)
MPV: 6.7 fl (ref 5.0–10.5)
Platelets: 326 10*3 (ref 135–450)
RBC: 4.14 10*6 (ref 4.0–5.2)
RDW: 14.9 % — ABNORMAL HIGH (ref 11.5–14.5)
WBC: 5.4 10*3 (ref 4.0–11.0)

## 2011-12-31 NOTE — Op Note (Unsigned)
PATIENT NAME:                 PA #:            MR #MISK, GALENTINE                 [id number]       [id number]            SURGEON:                              SURG DATE:  DIS DATE:          Filbert Berthold, MD                     12/31/2011                     DATE OF BIRTH:   AGE:           PATIENT TYPE:     RM #:              [id number]       60             [facility]                                     ASSISTANT:  Alvester Chou, DPM, Podiatry Resident.     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     PROCEDURES DONE:    1.  Repair of left distal tibia nonunion around total ankle replacement with  revision and synostosis with the fibula using a bone graft.  2.  Removal of ankle implant.  3.  Intraoperative fluoroscopy with interpretation.     ANESTHESIA:  General endotracheal.     ESTIMATED BLOOD LOSS:  Minimal.     TOURNIQUET:  Left upper thigh, 350 mmHg.  Total time 76 minutes.     IMPLANTS USED:  1.  Zimmer 5-hole one-third tubular locking plate with 3 locking screws and 2  syndesmosis screws, 3.5.  2.  Trinity cancellous bone graft with stem cell.     INDICATIONS:  This is a 61 year old white female who is almost 2 years out  from a left total ankle replacement.  The patient had the surgery done in  [facility] by Dr. Izell Carolina.  The patient was doing fairly well the first year,  but in the past few months she has been having more pain and swelling.  At  Christmastime the pain significantly increased with more swelling.  She was  seen in the office and that showed that she has a nonunion of the syndesmosis  and possible loosening on the lateral side.  All risks, benefits and  alternatives were discussed with the patient for revision of her left total  ankle replacement with repair of tibial nonunion with a synostosis to the  fibula.  The patient understands  all the risks, benefits and alternatives,  and she said that she will be nonweightbearing for at least 6 weeks and it  could be more, and she agreed to proceed with  surgical treatment.       SURGICAL PROCEDURE:  The patients left ankle was marked.  She received 1 g of  Ancef IV preoperatively.  The patient was then brought to the operating room  and placed in the supine position.  After satisfactory general endotracheal  anesthesia was administered, the well-padded tourniquet was placed on the  left upper thigh.  A bump was placed underneath the left hip.  The left lower  extremity was then prepped and draped in the regular sterile routine fashion.   Timeout was called, confirming the patient name, site and procedure.  C-arm  fluoroscopy confirmed the placement of the lateral plate.  At this point we  made an incision over the previous lateral incision.  The plate on the  lateral side was exposed.  We backed out 2 screws and then removed the plate  without difficulty.  At this point we dissected anteriorly to the syndesmosis  area.  We exposed the syndesmosis and it was obvious that she has a nonunion.   We used to curette, both curved and straight, and we removed all the  interposing soft tissue.  There was an obvious nonunion, as the fibula could  be easily moved.  We also removed the bone cyst that was in the distal  fibula.  The prosthesis itself appears to be stable.  There is some loosening  around the prosthesis on the lateral side of the tibia, and we used a small  curette to remove all the interposing soft tissue.  I did push on the  prosthesis.  It was found to be stable.  There was no movement around the  prosthesis.  We did remove all the interposing soft tissue at the  syndesmosis.  We also curetted the site where the previous screws were and  removed all the soft tissue.  We came all the way down to the _____ of the  tibia as well as the fibula.  At this point we used a 2.5 drill and we made  multiple  holes, both on the tibial side as well as the fibula side of the  syndesmosis.  C-arm fluoroscopy was used intraoperatively to confirm all the  previous steps.  At this point we used a Trinity 5-mL cancellous bone graft  with stem cell and we packed the bone cyst in the fibula as well as the  syndesmosis.  We had a good packing of the syndesmosis.  At this point we put  a 5-hole tubular locking plate from Zimmer.  We confirmed good position of  the plate.  At this point we put 2 locking cortical screws in the distal  fibula.  C-arm fluoroscopy confirmed that the plate was in good position.  At  this point, we used a periarticular clamp to compress the tibia and fibula  for a repair of the nonunion with synostosis to the fibula.  We made a small  incision on the medial side, where we put the clamp subcutaneously into the  distal tibia.  We squeezed the clamp down and we achieved good compression at  the distal tibia and fibula.  At this point, while maintaining the  compression, we put 2 screws across the syndesmosis with good compression and  good fixation.  At this point we added another locking screw in the shaft in  the fibula proximally with a total of 3 locking screws and 2 screws across  the syndesmosis.  At this point the clamp was removed and  the syndesmosis  compression was still well maintained.  C-arm fluoroscopy confirmed the good  filling of the syndesmosis for a repair of the distal tibia with synostosis  of the fibula.       Removal of the ankle implant was done at the beginning of the procedure with  removal of the 2 screws and the plate.       At this point we irrigated the incision copiously with normal saline.  We put  the tourniquet down.  Total time was 76 minutes.  We closed the deep layer  with 2-0 Vicryl, the subcutaneous tissue with 3-0, and skin with 2-0 Quill on  the lateral side with Steri-Strips on the medial side, and we closed the  subcutaneous tissue with 3-0 Vicryl and Steri-Strips.   A dressing was then  applied in the form of Xeroform, 4 x 4, sterile Webril, and a splint was  applied.     The patient tolerated the procedure well and was taken to Recovery in stable  condition.     POSTOPERATIVE PLAN:  The patient will be discharged home.  She will be  nonweightbearing.  She will be seen in the office in 2 weeks, at which point  in time we will switch her to a boot, start range of motion, but no weight  for at least 6 to 8 weeks.       Will order bone stimulator to increase her chances of bone healing.                                            Nyiesha Beever Revonda Humphrey, MD     [id number]  DD: 12/31/2011 12:12   DT: 12/31/2011 13:23   Job #: [id number]  CC: Adron Bene, MD  CC: Lantz Hermann Revonda Humphrey, MD

## 2012-01-01 NOTE — Telephone Encounter (Signed)
Faxed order for bone stim to [facility] at [phone]

## 2012-01-09 NOTE — Telephone Encounter (Signed)
Faxed records to insurance company for bone stimulator to [phone]. Spoke with patient and informed her of this and also called Joelene Millin to inform him of this also.

## 2012-01-13 NOTE — Progress Notes (Signed)
DIAGNOSIS:    1. 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   2. Removal of ankle implant(lateral plate).    DATE OF SURGERY:  11/30/2011.    HISTORY OF PRESENT ILLNESS:  Ms. Scearce 61 y.o. Caucasian female who came in today for one week postoperative visit.  The patient denies any significant pain in the left ankle. She has been in a splint, and non WB.  No numbness or tingling sensation. No fever or Chills.     PHYSICAL EXAMINATION:  The incision healing well lateral left ankle.  No signs of any erythema or drainage, minimal swelling.  She has no pain with the active or passive range of motion of the left ankle, but decrease ROM.  She has intact sensation distally, and she is neurovascularly intact.    IMAGING:  Three views left ankle showed anatomic alignment TAA, lateral plate and screws in good position, no loosening. Syndesmosis area with bone graft.    IMPRESSION: One week out from left TAA repair syndesmosis nonunion and doing very well.    PLAN: She placed in a boot, and non WB. I have told the patient to work on ROM, The patient will come back for a follow up in 6 weeks.  At that time, we will take 3 views of the left ankle. We ordered Exogen bone stimulator to help in bone union.

## 2012-01-17 NOTE — Communication Body (Signed)
DIAGNOSIS:  Status post left total ankle replacement by Dr. Izell Carolina in [place].      Date of surgery: 01/25/2010.       HISTORY OF PRESENT ILLNESS:  This is a 61 y.o. white female came in today for f/u of her left total ankle replacement and non union of the syndesmosis. She is here today for f/u and planning on surgical repair, and she is WBAT in regular shoes. She finished PT with good progress. She started c/o mild pain and swelling after she walked a lot during super ball party.    She went to have a total ankle replacement in [place] by Dr. Izell Carolina and spent 1 week in [place] where she was seen postoperatively in 1 week for a wound check.  At that time she was placed in a cast and asked to put about 30 pounds of weight 2 to 3 times a day to keep the tibial component in place.  At this point she denies any pain. No numbness or tingling sensation. She denies any fever or chills. Patient is otherwise fairly healthy and active.      The patient's past medical history, medications, and review of systems was reviewed.        PHYSICAL EXAMINATION:  Ms. Lanpher is a very pleasant 61 y.o. Caucasian female who presents today in no acute distress, awake, alert, and oriented.  She is well dressed, nourished and  groomed.  Patient with normal affect.  Height is  5\' 10"  (1.778 m), weight is 180 lb (81.647 kg).  Resting respiratory rate is 16, and pulse is 82.       The patient came in full weightbearing on the left lower extremity, with no significant limp. The anterior and the lateral incisions for the ankle replacement are healed well. No signs of any erythema or drainage. No pain with good range of motion of the left ankle.  She has intact sensation distally. Minimal tenderness medially and over the syndesmosis. DF 5, PF 45.     IMAGING:  X-rays were taken in the office today, standing of the left ankle, 3 views that showed the ankle replacement in place.  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     IMPRESSION:  20 months out from left total ankle replacement doing fairly well, syndesmosis non-union and osteolysis distal fibula.       PLAN:  Patient will continue to be WBAT, avoid heavy impact activities. I discussed with the patient that I think that she would need revision of the syndesmosis nonunion with bone graft.    I did send xray to Dr Izell Carolina, and he in agreement that she needs a revision of her TAA syndesmosis non union.    I discussed the risks and benefits of surgery with the patient, including but not limited to infection, bleeding, pain, injury to nerves or blood vessels failure of the surgery and need for additional surgery.  All the patient's questions were answered.   We discussed an expected post-operative course.  She  is understanding of this and wishes to proceed.              Dovid Bartko Revonda Humphrey, MD

## 2012-01-24 NOTE — Telephone Encounter (Signed)
Patient had ankle sx three weeks ago. States she fell and hit the spot she had sx on. States the ankle is a little stiff, no unsual pain or bruising , but scared she could have damaged or did something to effect the sx. Pls call patient

## 2012-01-24 NOTE — Telephone Encounter (Signed)
Spoke with Brooke Anderson and offered her an appt for tomorrow she states she thinks she is ok but if it gets worse then she will call to schedule an appt.

## 2012-02-29 MED ORDER — NAPROXEN 500 MG PO TABS
500 MG | ORAL_TABLET | Freq: Two times a day (BID) | ORAL | Status: DC
Start: 2012-02-29 — End: 2012-03-26

## 2012-02-29 NOTE — Telephone Encounter (Signed)
Patient had left total ankle done on 12/31/11 by [facility], had f/u on 02/19/12, Patient states she was told she could take boot off around house,and was ble to bear weight. States she having very bad pain , specifically in lower back area, even if she has boot on or off. States the pain is unbearable.Not sure why,asking for a call back at [phone].  She is aware [facility] of [facility]

## 2012-02-29 NOTE — Telephone Encounter (Signed)
Per [facility] phone call, ok to call in naprosyn. This was called in and appt made with Brett Canales on Monday. Advised patient if it got worse to go to ER for back pain.

## 2012-03-01 NOTE — Progress Notes (Signed)
DIAGNOSIS:    1. 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   2. Removal of ankle implant(lateral plate).    DATE OF SURGERY:  11/30/2011.    HISTORY OF PRESENT ILLNESS:  Brooke Anderson 60 y.o. Caucasian female who came in today for 10 weeks postoperative visit.  The patient denies any significant pain in the left ankle. She has been in a boot, and non WB.  No numbness or tingling sensation. No fever or Chills.     PHYSICAL EXAMINATION:  The incision healed well lateral left ankle.  No signs of any erythema or drainage, minimal swelling.  She has no pain with the active or passive range of motion of the left ankle, but decrease ROM.  She has intact sensation distally, and she is neurovascularly intact. No tenderness.    IMAGING:  Three views left ankle showed anatomic alignment TAA, lateral plate and screws in good position, no loosening. Syndesmosis area with with good bone formation and healing well.    IMPRESSION: 10 weeks out from left TAA repair syndesmosis nonunion and doing very well.    PLAN: She will be WBAT in the boot, and start aggressive ROM and peroneal strengthening exercise. Off the boot in 2-3 weeks.  The patient will come back for a follow up in 6 weeks.  At that time, we will take 3 views of the left ankle standing. Continue to use Exogen bone stimulator to help in bone union.

## 2012-03-04 NOTE — Communication Body (Signed)
DIAGNOSIS:    1. 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   2. Removal of ankle implant(lateral plate).    DATE OF SURGERY:  11/30/2011.    HISTORY OF PRESENT ILLNESS:  Ms. Brooke Anderson 61 y.o. Caucasian female who came in today for 10 weeks postoperative visit.  The patient denies any significant pain in the left ankle. She has been in a boot, and non WB.  No numbness or tingling sensation. No fever or Chills.     PHYSICAL EXAMINATION:  The incision healed well lateral left ankle.  No signs of any erythema or drainage, minimal swelling.  She has no pain with the active or passive range of motion of the left ankle, but decrease ROM.  She has intact sensation distally, and she is neurovascularly intact. No tenderness.    IMAGING:  Three views left ankle showed anatomic alignment TAA, lateral plate and screws in good position, no loosening. Syndesmosis area with with good bone formation and healing well.    IMPRESSION: 10 weeks out from left TAA repair syndesmosis nonunion and doing very well.    PLAN: She will be WBAT in the boot, and start aggressive ROM and peroneal strengthening exercise. Off the boot in 2-3 weeks.  The patient will come back for a follow up in 6 weeks.  At that time, we will take 3 views of the left ankle standing. Continue to use Exogen bone stimulator to help in bone union.

## 2012-03-19 NOTE — Progress Notes (Signed)
DIAGNOSIS:    1. 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   2. Removal of ankle implant(lateral plate).    DATE OF SURGERY:  11/30/2011.    HISTORY OF PRESENT ILLNESS:  Brooke Anderson 61 y.o. Caucasian female who came in today for 3 months postoperative visit.  The patient denies any significant pain in the left ankle. She has been in regular shoes, and full WB.  No numbness or tingling sensation. No fever or Chills. She had LBP few weeks ago that resolved with Naprosyn.    The patient's past medical history, medications, and review of systems was reviewed.        PHYSICAL EXAMINATION:  Brooke Anderson is a very pleasant 61 y.o. Caucasian female who presents today in no acute distress, awake, alert, and oriented.  She is well dressed, nourished and  groomed.  Patient with normal affect.  Height is  5' 10" (1.778 m), weight is 185 lb (83.915 kg).  Resting respiratory rate is 16.   The patient walks with minimal limp. The incision healed well lateral left ankle.  No signs of any erythema or drainage, minimal swelling.  She has no pain with the active or passive range of motion of the left ankle, but decrease ROM.  She has intact sensation distally, and she is neurovascularly intact. No tenderness. Good strength, and no instability both lower extremities. Ankle reflex 1+ bilaterally.     IMAGING:  Three views left ankle showed anatomic alignment TAA, lateral plate and screws in good position, no loosening. Syndesmosis area with with good bone formation and healed well.    IMPRESSION: 3 months out from left TAA repair syndesmosis nonunion and doing very well.    PLAN: She will be WBAT, and start aggressive ROM and peroneal strengthening exercise. The patient will come back for a follow up in 3 months  At that time, we will take 3 views of the left ankle standing. Continue to use Exogen bone stimulator to help in bone union. PT then if  needed.

## 2012-03-20 ENCOUNTER — Inpatient Hospital Stay
Admit: 2012-03-20 | Discharge: 2012-03-20 | Disposition: A | Discharge status: Home / Self Care | Attending: Emergency Medicine

## 2012-03-20 MED ORDER — CYCLOBENZAPRINE HCL 5 MG PO TABS
5 MG | ORAL_TABLET | Freq: Three times a day (TID) | ORAL | Status: AC | PRN
Start: 2012-03-20 — End: 2012-03-23

## 2012-03-20 MED ORDER — OXYCODONE-ACETAMINOPHEN 5-325 MG PO TABS
5-325 MG | ORAL_TABLET | ORAL | Status: AC | PRN
Start: 2012-03-20 — End: 2012-03-23

## 2012-03-20 MED ADMIN — diazepam (VALIUM) tablet 5 mg: ORAL | @ 23:00:00 | NDC 63739007310

## 2012-03-20 MED ADMIN — ketorolac (TORADOL) 60 MG/2ML injection: INTRAMUSCULAR | @ 23:00:00 | NDC 00409379601

## 2012-03-20 MED ADMIN — HYDROcodone-acetaminophen (NORCO) 5-325 MG per tablet 2 tablet: 2 | ORAL | @ 23:00:00 | NDC 00406036562

## 2012-03-20 MED FILL — HYDROCODONE-ACETAMINOPHEN 5-325 MG PO TABS: 5-325 MG | ORAL | Qty: 2

## 2012-03-20 MED FILL — KETOROLAC TROMETHAMINE 60 MG/2ML IJ SOLN: 60 MG/2ML | INTRAMUSCULAR | Qty: 2

## 2012-03-20 MED FILL — DIAZEPAM 5 MG PO TABS: 5 MG | ORAL | Qty: 1

## 2012-03-20 NOTE — ED Notes (Signed)
Pt stated that her son would be driving her home and is aware that she is not able to drive tonight. Pt given 2 rx and all discharge instructions. Pt acknowledged understanding of instructions and was able to ambulate out of the ER in stable condition.     Glade Stanford, RN  03/20/12 1956

## 2012-03-20 NOTE — Discharge Instructions (Signed)

## 2012-03-20 NOTE — ED Notes (Signed)
Pt c/o left neck and shoulder pain. Pt denies any injury. 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     Glade Stanford, RN  03/20/12 1911

## 2012-03-20 NOTE — ED Provider Notes (Signed)
ED Attending Attestation Note     This patient was seen by the mid-level provider.  I have seen and examined the patient, agree with the workup, evaluation, management and diagnosis. The care plan has been discussed.  My assessment reveals complaints of pain on left side of the neck.  Patient does have reproducible tenderness to the trapezius muscle with no midline cervical spine tenderness.  Will treat symptomatically.    Leota Sauers, MD  03/20/12 [phone]

## 2012-03-20 NOTE — ED Notes (Signed)
List of medications patient is currently taking is complete.  Source of medications in list are pt recall.    Darol Destine, RN  03/20/12 1859

## 2012-03-20 NOTE — ED Provider Notes (Signed)
Chief Complaint   Neck Pain and Shoulder Pain      Nursing Notes, Past Medical Hx, Past Surgical Hx, Social Hx, Allergies, and Family Hx were all reviewed and agreed with, or any disagreements were addressed in the HPI.    History of Present Illness     Brooke Anderson is a 61 y.o. female who presents with left-sided neck and shoulder pain.  She reports that the pain started yesterday and has gotten worse throughout the day today.  She states it starts in the left side of her neck and radiates down into her shoulder, following the trapezius muscle.  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  She reports the pain is worse with movement and had a very difficult time putting her shirt and bra on.  She reports that her fingertips and both hands are tingling, and the patient is very anxious and tearful.  She reports she looked up on the Internet and is concerned that she may have Loucille Takach Nile virus or meningitis.  She denies fevers/chills, nausea vomiting, rash, or recalling any insect bite.  She found a small red dots on the back of her neck and is concerned it is a insect bite.  She denies any chest pain, dyspnea, or other symptoms the    Review of Systems     As above, otherwise negative per the patient.    Past Medical, Surgical, Family, and Social History         Diagnosis Date   ??? Hypertension    ??? Arthritis       RT SHOULDER/ VARIOUS JOINTS   ??? ADD (attention deficit disorder with hyperactivity)    ??? Depression    ??? Thyroid disease      workup for nodule thyroid Ultrasound  wk 08/13/11         Procedure Laterality Date   ??? Joint replacement       LEFT ANKLE REPLACEMENT   ??? Eye surgery  01/2010     eyelid bilaterally   ??? Shoulder arthroplasty  08-27-2011     Right total shoulder arthroplsty, biceps tenodesis augmented steptek glenoid/depuy global AP     Her family history includes Arthritis in her father and sister; Cancer in her  mother; and Other in her mother and sister.  She reports that she has never smoked. She has never used smokeless tobacco. She reports that  drinks alcohol. She reports that she does not use illicit drugs.    Medications     Previous Medications    ACETAMINOPHEN (TYLENOL EX ST ARTHRITIS PAIN PO)    Take 2 tablets by mouth daily as needed.    AMPHETAMINE-DEXTROAMPHETAMINE (ADDERALL PO)    Take 20 mg by mouth daily. Indications: CURRENTLY OUT OF MEDICATION    BUPROPION (WELLBUTRIN XL) 300 MG XL TABLET    every morning.    EFFEXOR XR 75 MG XR CAPSULE    daily.    LISINOPRIL (PRINIVIL;ZESTRIL) 20 MG TABLET    Take 20 mg by mouth daily.      MULTIPLE VITAMIN (MULTI-VITAMIN PO)    Take 1 tablet by mouth daily.    NAPROXEN (NAPROSYN) 500 MG TABLET    Take 1 tablet by mouth 2 times daily (with meals).    NAPROXEN SODIUM (ALEVE) 220 MG TABLET    Take 220 mg by mouth 2 times daily (with meals).    Indications: 2  TABLETS IN AM AND 1 EVENING PRN    OXYCODONE-ACETAMINOPHEN (PERCOCET) 5-325 MG PER TABLET    Take 1-2 tablets by mouth every 4 hours as needed for Pain.       Allergies     She  has no known allergies.    Physical Exam     INITIAL VITALS: BP 160/73   Pulse 84   Temp(Src) 98.8 ??F (37.1 ??C) (Oral)   Resp 20   Ht 5\' 9"  (1.753 m)   Wt 183 lb (83.008 kg)   BMI 27.01 kg/m2   SpO2 100%     General: Well-developed well-nourished in no acute distress.  HEENT: Head normocephalic atraumatic.  Pupils equal and round.  External ears and nose normal.  No cervical lymphadenopathy.  Neck: Full range of motion, supple.  Pulmonary: Lungs clear to auscultation bilaterally.  No wheezing, rhonchi, or rales.  Cardiac: Regular rate and rhythm.  No murmurs, rubs, or gallops.  Clear S1, S2.  Musculoskeletal: Tenderness noted to the left cervical paraspinous musculature and left trapezius.  Pain is made worse with lateral rotation of the neck to the left and right as well as extension.  Axial compression elucidates her neck pain and radicular  symptoms.  Neurovascularly intact to the bilateral upper extremities with 5 over 5 strength and full sensation.  Vascular: Palpable pulses to all extremities.  Skin: Small 2 mm dots of erythema to the back of the neck with no elevation, induration, or extending erythema or pustule.  Neuro: Alert and oriented.  Cranial nerves II through XII without deficit.    Psych:  Normal affect, Normal judgement, Normal mood, affect and behavior.      Diagnostic Results     RECENT VITALS:  BP: 160/73 mmHg, Temp: 98.8 ??F (37.1 ??C), Pulse: 84 , Resp: 20      Procedures       ED Course     The patient was given the following medications:  Orders Placed This Encounter   Medications   ??? ketorolac (TORADOL) injection 60 mg     Sig:    ??? HYDROcodone-acetaminophen (NORCO) 5-325 MG per tablet 2 tablet     Sig:    ??? diazepam (VALIUM) tablet 5 mg     Sig:    ??? ketorolac (TORADOL) 60 MG/2ML injection     Sig:      COBB, MICHELE: cabinet override   ??? oxyCODONE-acetaminophen (PERCOCET) 5-325 MG per tablet     Sig: Take 1-2 tablets by mouth every 4 hours as needed for Pain for 3 days.     Dispense:  15 tablet     Refill:  0   ??? cyclobenzaprine (FLEXERIL) 5 MG tablet     Sig: Take 1 tablet by mouth 3 times daily as needed (muscle pain) for 3 days.     Dispense:  10 tablet     Refill:  0       CONSULTS:  None    MEDICAL DECISION MAKING     Brooke Anderson is a 61 y.o. female who presents with left-sided neck pain that radiates into the shoulder.  Her exam is consistent with a musculoskeletal injury, given that it is reproducible by multiple mechanisms.  She has a normal neurovascular exam with no evidence of weakness or decreased sensation.  I do not feel she requires any imaging studies at this time.  We will treat her symptomatically with Toradol, Norco and Valium.  I do not feel that this is an  infectious process, or related to a cardiopulmonary process.  I will discharge her home with pain medications and encouraged her to followup with her  primary care physician for this pain.    This patient was also evaluated by the attending physician. All care plans were discussed and agreed upon.    Clinical Impression     1. Neck pain        Disposition/Plan     PATIENT REFERRED TO:  Adron Bene, MD  [address] [place] [address]  [phone]    Schedule an appointment as soon as possible for a visit      [facility] EMERGENCY DEPT  [address] [place] [address]  [phone]    If symptoms worsen      DISCHARGE MEDICATIONS:  New Prescriptions    CYCLOBENZAPRINE (FLEXERIL) 5 MG TABLET    Take 1 tablet by mouth 3 times daily as needed (muscle pain) for 3 days.    OXYCODONE-ACETAMINOPHEN (PERCOCET) 5-325 MG PER TABLET    Take 1-2 tablets by mouth every 4 hours as needed for Pain for 3 days.       DISPOSITION Decision to Discharge    (Please note that portions of this note were completed with voice recognition software.  Efforts were made to edit the dictations but occasionally words are mis-transcribed.)      [place] Cashel Bellina, [place]  03/20/12 1926

## 2012-03-26 MED ORDER — NAPROXEN 500 MG PO TABS
500 MG | ORAL_TABLET | ORAL | Status: AC
Start: 2012-03-26 — End: ?

## 2012-03-31 ENCOUNTER — Telehealth

## 2012-03-31 NOTE — Telephone Encounter (Signed)
Patient calling to get an order for physical therapy faxed to [facility]. She will call back with a fax number.

## 2012-04-01 NOTE — Telephone Encounter (Signed)
Called lm/voicemail to call back with fax number. Referral for PT in [facility]

## 2012-04-02 NOTE — Telephone Encounter (Signed)
This was faxed to [facility] on [phone] per patient request.

## 2012-04-23 NOTE — Communication Body (Signed)
DIAGNOSIS:    1. 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   2. Removal of ankle implant(lateral plate).    DATE OF SURGERY:  11/30/2011.    HISTORY OF PRESENT ILLNESS:  Ms. Brooke Anderson 61 y.o. Caucasian female who came in today for 3 months postoperative visit.  The patient denies any significant pain in the left ankle. She has been in regular shoes, and full WB.  No numbness or tingling sensation. No fever or Chills. She had LBP few weeks ago that resolved with Naprosyn.    The patient's past medical history, medications, and review of systems was reviewed.        PHYSICAL EXAMINATION:  Ms. Brooke Anderson is a very pleasant 61 y.o. Caucasian female who presents today in no acute distress, awake, alert, and oriented.  She is well dressed, nourished and  groomed.  Patient with normal affect.  Height is  5\' 10"  (1.778 m), weight is 185 lb (83.915 kg).  Resting respiratory rate is 16.   The patient walks with minimal limp. The incision healed well lateral left ankle.  No signs of any erythema or drainage, minimal swelling.  She has no pain with the active or passive range of motion of the left ankle, but decrease ROM.  She has intact sensation distally, and she is neurovascularly intact. No tenderness. Good strength, and no instability both lower extremities. Ankle reflex 1+ bilaterally.     IMAGING:  Three views left ankle showed anatomic alignment TAA, lateral plate and screws in good position, no loosening. Syndesmosis area with with good bone formation and healed well.    IMPRESSION: 3 months out from left TAA repair syndesmosis nonunion and doing very well.    PLAN: She will be WBAT, and start aggressive ROM and peroneal strengthening exercise. The patient will come back for a follow up in 3 months  At that time, we will take 3 views of the left ankle standing. Continue to use Exogen bone stimulator to help in bone union. PT then if  needed.

## 2012-05-21 NOTE — Telephone Encounter (Signed)
error 

## 2012-06-18 NOTE — Progress Notes (Signed)
DIAGNOSIS:    1. 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   2. Removal of ankle implant(lateral plate).    DATE OF SURGERY:  11/30/2011.    HISTORY OF PRESENT ILLNESS:  Brooke Anderson 61 y.o. Caucasian female who came in today for 6 months postoperative visit.  The patient denies any significant pain in the left ankle. She has been in regular shoes, and full WB.  No numbness or tingling sensation. No fever or Chills. She had LBP few months ago that resolved with Naprosyn.    The patient's past medical history, medications, and review of systems was reviewed.        PHYSICAL EXAMINATION:  Brooke Anderson is a very pleasant 61 y.o. Caucasian female who presents today in no acute distress, awake, alert, and oriented.  She is well dressed, nourished and  groomed.  Patient with normal affect.  Height is  5' 10" (1.778 m), weight is 185 lb (83.915 kg).  Resting respiratory rate is 16.   The patient walks with minimal limp. The incision healed well lateral left ankle.  No signs of any erythema or drainage, minimal swelling.  She has no pain with the active or passive range of motion of the left ankle, but decrease ROM.  She has intact sensation distally, and she is neurovascularly intact. No tenderness. Good strength, and no instability both lower extremities. Ankle reflex 1+ bilaterally.     IMAGING:  Three views left ankle showed anatomic alignment TAA, lateral plate and screws in good position, no loosening. Syndesmosis area with with good bone formation and healed well.    IMPRESSION: 6 months out from left TAA repair syndesmosis nonunion and doing very well.    PLAN: She will be WBAT, and start aggressive ROM and peroneal strengthening exercise. The patient will come back for a follow up in 3 months  At that time, we will take 3 views of the left ankle standing. Continue to use Exogen bone stimulator to help in bone union.

## 2012-07-14 NOTE — Communication Body (Signed)
DIAGNOSIS:    1. 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   2. Removal of ankle implant(lateral plate).    DATE OF SURGERY:  11/30/2011.    HISTORY OF PRESENT ILLNESS:  Ms. Brooke Anderson 61 y.o. Caucasian female who came in today for 6 months postoperative visit.  The patient denies any significant pain in the left ankle. She has been in regular shoes, and full WB.  No numbness or tingling sensation. No fever or Chills. She had LBP few months ago that resolved with Naprosyn.    The patient's past medical history, medications, and review of systems was reviewed.        PHYSICAL EXAMINATION:  Ms. Brooke Anderson is a very pleasant 61 y.o. Caucasian female who presents today in no acute distress, awake, alert, and oriented.  She is well dressed, nourished and  groomed.  Patient with normal affect.  Height is  5\' 10"  (1.778 m), weight is 185 lb (83.915 kg).  Resting respiratory rate is 16.   The patient walks with minimal limp. The incision healed well lateral left ankle.  No signs of any erythema or drainage, minimal swelling.  She has no pain with the active or passive range of motion of the left ankle, but decrease ROM.  She has intact sensation distally, and she is neurovascularly intact. No tenderness. Good strength, and no instability both lower extremities. Ankle reflex 1+ bilaterally.     IMAGING:  Three views left ankle showed anatomic alignment TAA, lateral plate and screws in good position, no loosening. Syndesmosis area with with good bone formation and healed well.    IMPRESSION: 6 months out from left TAA repair syndesmosis nonunion and doing very well.    PLAN: She will be WBAT, and start aggressive ROM and peroneal strengthening exercise. The patient will come back for a follow up in 3 months  At that time, we will take 3 views of the left ankle standing. Continue to use Exogen bone stimulator to help in bone union.

## 2012-09-27 NOTE — Progress Notes (Signed)
DIAGNOSIS:    1. 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   2. Removal of ankle implant(lateral plate).    DATE OF SURGERY:  11/30/2011.    HISTORY OF PRESENT ILLNESS:  Brooke Anderson 62 y.o. Caucasian female who came in today for 9 months postoperative visit.  The patient denies any significant pain in the left ankle. She has been in regular shoes, and full WB.  No numbness or tingling sensation. No fever or Chills. She had LBP few months ago that resolved with Naprosyn.    The patient's past medical history, medications, and review of systems was reviewed.        PHYSICAL EXAMINATION:  Brooke Anderson is a very pleasant 62 y.o. Caucasian female who presents today in no acute distress, awake, alert, and oriented.  She is well dressed, nourished and  groomed.  Patient with normal affect.  Height is  5\' 10"  (1.778 m), weight is 185 lb (83.915 kg).  Resting respiratory rate is 16.   The patient walks with minimal limp. The incision healed well lateral left ankle.  No signs of any erythema or drainage, minimal swelling.  She has no pain with the active or passive range of motion of the left ankle, but decrease ROM.  She has intact sensation distally, and she is neurovascularly intact. No tenderness. Good strength, and no instability both lower extremities. Ankle reflex 1+ bilaterally.     IMAGING:  Three views left ankle showed anatomic alignment TAA, lateral plate and screws in good position, no loosening. Syndesmosis area with with good bone formation and healed well.    IMPRESSION: 9 months out from left TAA repair syndesmosis nonunion and doing very well.    PLAN: She will be WBAT, and continue aggressive ROM and peroneal strengthening exercise. The patient will come back for a follow up in 3 months  At that time, we will take 3 views of the left ankle standing. Continue to use Exogen bone stimulator to help in bone union.

## 2012-10-03 LAB — CBC
Hematocrit: 38.2 % (ref 36.0–48.0)
Hemoglobin: 12.3 g/dL (ref 12.0–16.0)
MCH: 28.4 pg (ref 26.0–34.0)
MCHC: 32.2 g/dL (ref 31.0–36.0)
MCV: 88.3 fL (ref 80.0–100.0)
MPV: 7.3 fL (ref 5.0–10.5)
Platelets: 375 10*3/uL (ref 135–450)
RBC: 4.33 M/uL (ref 4.00–5.20)
RDW: 14.8 % (ref 12.4–15.4)
WBC: 4.7 10*3/uL (ref 4.0–11.0)

## 2012-10-03 LAB — HEPATIC FUNCTION PANEL
ALT: 25 U/L (ref 10–40)
AST: 26 U/L (ref 15–37)
Albumin: 4.1 g/dL (ref 3.4–5.0)
Alkaline Phosphatase: 80 U/L (ref 40–129)
Bilirubin, Direct: 0.1 mg/dL (ref 0.0–0.3)
Bilirubin, Indirect: 0.2 mg/dL (ref 0.0–1.0)
Total Bilirubin: 0.3 mg/dL (ref 0.0–1.0)
Total Protein: 7.1 g/dL (ref 6.4–8.2)

## 2012-10-03 LAB — RENAL FUNCTION PANEL
BUN: 17 mg/dL (ref 7–20)
CO2: 26 mmol/L (ref 21–32)
Calcium: 9.4 mg/dL (ref 8.3–10.6)
Chloride: 97 mmol/L — ABNORMAL LOW (ref 99–110)
Creatinine: 0.9 mg/dL (ref 0.6–1.2)
GFR African American: >60 (ref >60)
GFR Non-African American: >60 (ref >60)
Glucose: 107 mg/dL — ABNORMAL HIGH (ref 70–99)
Phosphorus: 3.9 mg/dL (ref 2.5–4.9)
Potassium: 4.2 mmol/L (ref 3.5–5.1)
Sodium: 135 mmol/L — ABNORMAL LOW (ref 136–145)

## 2012-10-03 LAB — TSH: TSH: 2.69 u[IU]/mL (ref 0.27–4.20)

## 2012-10-08 ENCOUNTER — Encounter

## 2012-10-08 MED ADMIN — ioversol (OPTIRAY) 74 % injection 100 mL: INTRAVENOUS | @ 15:00:00 | NDC 00019133361

## 2012-10-08 MED ADMIN — iohexol (OMNIPAQUE 240) injection 50 mL: ORAL | @ 13:00:00 | NDC 00407141230

## 2013-07-22 MED ORDER — DICLOFENAC SODIUM 50 MG PO TBEC
50 MG | ORAL_TABLET | Freq: Two times a day (BID) | ORAL | Status: AC
Start: 2013-07-22 — End: ?

## 2013-07-22 MED ORDER — DICLOFENAC SODIUM 50 MG PO TBEC
50 MG | ORAL_TABLET | Freq: Two times a day (BID) | ORAL | Status: DC
Start: 2013-07-22 — End: 2013-07-22

## 2013-07-22 MED ORDER — METHYLPREDNISOLONE 4 MG PO TBPK
4 MG | PACK | ORAL | Status: DC
Start: 2013-07-22 — End: 2013-07-22

## 2013-07-22 MED ORDER — METHYLPREDNISOLONE 4 MG PO TBPK
4 MG | PACK | ORAL | Status: AC
Start: 2013-07-22 — End: 2013-07-28

## 2013-07-23 NOTE — Telephone Encounter (Signed)
Fyi- pt was seen yesterday and states she just wanted to let [facility] know the meds she was given yesterday (steroid and pain med) have helped her pain

## 2013-07-26 NOTE — Progress Notes (Signed)
DIAGNOSIS:    1. 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   2. 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      DATE OF SURGERY:  11/30/2011.    HISTORY OF PRESENT ILLNESS:  Brooke Anderson 62 y.o. Caucasian female who came in today for evaluation of swelling and mild pain left ankle with no trauma. She has been in regular shoes, and full WB.  No numbness or tingling sensation. No fever or Chills. She had LBP that resolved with Naprosyn.    The patient's past medical history, medications, and review of systems was reviewed.        PHYSICAL EXAMINATION:  Brooke Anderson is a very pleasant 62 y.o. Caucasian female who presents today in no acute distress, awake, alert, and oriented.  She is well dressed, nourished and  groomed.  Patient with normal affect.  Height is  5' 10" (1.778 m), weight is 189 lb (85.73 kg).  Resting respiratory rate is 16.     The patient walks with minimal limp. The incision healed well lateral left ankle.  No signs of any erythema or drainage, mild swelling.  She has no pain with the active or passive range of motion of the left ankle, but decrease ROM.  She has intact sensation distally, and she is neurovascularly intact. No tenderness. Good strength, and no instability both lower extremities. Ankle reflex 1+ bilaterally.     IMAGING:  Three views left ankle showed anatomic alignment TAA, lateral plate and screws in good position, no loosening, no significant osteolysis. Syndesmosis area with with good bone formation and healed well.    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  2-  Left ankle synovitis.    PLAN: She will be WBAT, and continue aggressive ROM and peroneal strengthening exercise. Medrol dose Pack and Voltaren. The patient will come back for a follow up in 6 weeks at that time, we will take 3 views of the left ankle standing.       Trace Wirick , MD

## 2013-08-06 NOTE — Communication Body (Signed)
DIAGNOSIS:    1. 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   2. Left total ankle replacement by Dr. Izell Carolinaonti in [place], 01/25/2010.      DATE OF SURGERY:  11/30/2011.    HISTORY OF PRESENT ILLNESS:  Ms. Brooke Anderson 62 y.o. Caucasian female who came in today for evaluation of swelling and mild pain left ankle with no trauma. She has been in regular shoes, and full WB.  No numbness or tingling sensation. No fever or Chills. She had LBP that resolved with Naprosyn.    The patient's past medical history, medications, and review of systems was reviewed.        PHYSICAL EXAMINATION:  Ms. Brooke Anderson is a very pleasant 62 y.o. Caucasian female who presents today in no acute distress, awake, alert, and oriented.  She is well dressed, nourished and  groomed.  Patient with normal affect.  Height is  5\' 10"  (1.778 m), weight is 189 lb (85.73 kg).  Resting respiratory rate is 16.     The patient walks with minimal limp. The incision healed well lateral left ankle.  No signs of any erythema or drainage, mild swelling.  She has no pain with the active or passive range of motion of the left ankle, but decrease ROM.  She has intact sensation distally, and she is neurovascularly intact. No tenderness. Good strength, and no instability both lower extremities. Ankle reflex 1+ bilaterally.     IMAGING:  Three views left ankle showed anatomic alignment TAA, lateral plate and screws in good position, no loosening, no significant osteolysis. Syndesmosis area with with good bone formation and healed well.    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  2-  Left ankle synovitis.    PLAN: She will be WBAT, and continue aggressive ROM and peroneal strengthening exercise. Medrol dose Pack and Voltaren. The patient will come back for a follow up in 6 weeks at that time, we will take 3 views of the left ankle standing.       Wandra MannanSameh  Arebi , MD

## 2013-08-17 ENCOUNTER — Encounter

## 2013-08-17 NOTE — Telephone Encounter (Signed)
Dr. Arebi please advise

## 2013-08-18 MED ORDER — DICLOFENAC SODIUM 50 MG PO TBEC
50 MG | ORAL_TABLET | ORAL | Status: DC
Start: 2013-08-18 — End: 2013-09-13

## 2013-09-14 NOTE — Telephone Encounter (Signed)
Dr. Arebi please advise

## 2013-09-23 MED ORDER — DICLOFENAC SODIUM 50 MG PO TBEC
50 MG | ORAL_TABLET | ORAL | Status: DC
Start: 2013-09-23 — End: 2014-03-22

## 2014-01-18 MED ORDER — DICLOFENAC SODIUM 50 MG PO TBEC
50 MG | ORAL_TABLET | Freq: Two times a day (BID) | ORAL | Status: AC
Start: 2014-01-18 — End: ?

## 2014-01-18 NOTE — Telephone Encounter (Signed)
Per [facility] ok to refill  Rx sent to pharmacy

## 2014-01-18 NOTE — Telephone Encounter (Signed)
Brooke ElkSusan Anderson is calling for a refill of the following medication: Diclofenac 500mg   Last filled: 07/22/2013  Last OV: 07/22/13  Next OV: n/a    The patients preferred pharmacy was confirmed and the patient is aware that a response to the request may take up to 24 hours, and to check with the pharmacy prior to calling the office back.

## 2014-01-26 MED ORDER — METHYLPREDNISOLONE 4 MG PO TABS
4 MG | ORAL_TABLET | Freq: Every day | ORAL | Status: AC
Start: 2014-01-26 — End: 2014-02-01

## 2014-01-26 NOTE — Progress Notes (Signed)
DIAGNOSIS:    1. 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   2. Left total ankle replacement by Dr. Izell Carolinaonti in [place], 01/25/2010.      DATE OF SURGERY:  11/30/2011.    HISTORY OF PRESENT ILLNESS:  Brooke Anderson 63 y.o. Caucasian female who came in today for f/u of swelling and mild pain left ankle with no trauma, and also h/o locking left ankle. She has been in regular shoes, and full WB.  No numbness or tingling sensation. No fever or Chills. She had LBP that resolved with Naprosyn.    The patient's past medical history, medications, and review of systems was reviewed.        PHYSICAL EXAMINATION:  Brooke Anderson is a very pleasant 63 y.o. Caucasian female who presents today in no acute distress, awake, alert, and oriented.  She is well dressed, nourished and  groomed.  Patient with normal affect.  Height is  5\' 10"  (1.778 m), weight is 189 lb (85.73 kg).  Resting respiratory rate is 16.     The patient walks with no limp. The incision healed well lateral left ankle.  No signs of any erythema or drainage, mild swelling.  She has no pain with the active or passive range of motion of the left ankle, but decrease ROM.  She has intact sensation distally, and she is neurovascularly intact. No tenderness. Good strength, and no instability both lower extremities. Ankle reflex 1+ bilaterally.     IMAGING:  Three views left ankle showed anatomic alignment TAA, lateral plate and screws in good position, no loosening, no significant osteolysis. Syndesmosis area with with good bone formation and healed well.    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  2-  Left ankle synovitis.    PLAN: She will be WBAT, and continue aggressive ROM and peroneal strengthening exercise. Medrol dose Pack and Voltaren. The patient will come back for a follow up in 6 months at that time, we will take 3 views of the left ankle  standing.       Jakyra Kenealy M. Su HoffArebi , MD

## 2014-03-22 NOTE — Telephone Encounter (Signed)
Dr. Arebi please advise

## 2014-03-23 MED ORDER — DICLOFENAC SODIUM 50 MG PO TBEC
50 MG | ORAL_TABLET | ORAL | Status: DC
Start: 2014-03-23 — End: 2014-04-19

## 2014-04-13 ENCOUNTER — Emergency Department (HOSPITAL_COMMUNITY): Payer: 59

## 2014-04-13 ENCOUNTER — Emergency Department (HOSPITAL_COMMUNITY)
Admission: EM | Admit: 2014-04-13 | Discharge: 2014-04-13 | Disposition: A | Discharge status: Home / Self Care | Payer: 59 | Attending: Emergency Medicine | Admitting: Emergency Medicine

## 2014-04-13 ENCOUNTER — Encounter (HOSPITAL_COMMUNITY): Payer: Self-pay | Admitting: Emergency Medicine

## 2014-04-13 DIAGNOSIS — S1093XA Contusion of unspecified part of neck, initial encounter: Principal | ICD-10-CM

## 2014-04-13 DIAGNOSIS — IMO0002 Reserved for concepts with insufficient information to code with codable children: Secondary | ICD-10-CM | POA: Insufficient documentation

## 2014-04-13 DIAGNOSIS — T07XXXA Unspecified multiple injuries, initial encounter: Secondary | ICD-10-CM

## 2014-04-13 DIAGNOSIS — Z791 Long term (current) use of non-steroidal anti-inflammatories (NSAID): Secondary | ICD-10-CM | POA: Insufficient documentation

## 2014-04-13 DIAGNOSIS — S0003XA Contusion of scalp, initial encounter: Secondary | ICD-10-CM | POA: Insufficient documentation

## 2014-04-13 DIAGNOSIS — Z79899 Other long term (current) drug therapy: Secondary | ICD-10-CM | POA: Insufficient documentation

## 2014-04-13 DIAGNOSIS — W1809XA Striking against other object with subsequent fall, initial encounter: Secondary | ICD-10-CM | POA: Insufficient documentation

## 2014-04-13 DIAGNOSIS — Y93K1 Activity, walking an animal: Secondary | ICD-10-CM | POA: Insufficient documentation

## 2014-04-13 DIAGNOSIS — W19XXXA Unspecified fall, initial encounter: Secondary | ICD-10-CM

## 2014-04-13 DIAGNOSIS — F3289 Other specified depressive episodes: Secondary | ICD-10-CM | POA: Insufficient documentation

## 2014-04-13 DIAGNOSIS — S0083XA Contusion of other part of head, initial encounter: Secondary | ICD-10-CM | POA: Insufficient documentation

## 2014-04-13 DIAGNOSIS — S0990XA Unspecified injury of head, initial encounter: Secondary | ICD-10-CM | POA: Insufficient documentation

## 2014-04-13 DIAGNOSIS — Z23 Encounter for immunization: Secondary | ICD-10-CM | POA: Insufficient documentation

## 2014-04-13 DIAGNOSIS — Y9289 Other specified places as the place of occurrence of the external cause: Secondary | ICD-10-CM | POA: Insufficient documentation

## 2014-04-13 DIAGNOSIS — F329 Major depressive disorder, single episode, unspecified: Secondary | ICD-10-CM | POA: Insufficient documentation

## 2014-04-13 DIAGNOSIS — I10 Essential (primary) hypertension: Secondary | ICD-10-CM | POA: Insufficient documentation

## 2014-04-13 HISTORY — DX: Essential (primary) hypertension: I10

## 2014-04-13 HISTORY — DX: Depression, unspecified: F32.A

## 2014-04-13 HISTORY — DX: Major depressive disorder, single episode, unspecified: F32.9

## 2014-04-13 MED ORDER — OXYCODONE-ACETAMINOPHEN 5-325 MG PO TABS
1.0000 | ORAL_TABLET | Freq: Once | ORAL | Status: AC
  Administered 2014-04-13: 1 via ORAL
  Filled 2014-04-13: qty 1

## 2014-04-13 MED ORDER — OXYCODONE-ACETAMINOPHEN 5-325 MG PO TABS: 1.0000 | ORAL_TABLET | ORAL | Status: AC | PRN

## 2014-04-13 MED ORDER — TETANUS-DIPHTH-ACELL PERTUSSIS 5-2.5-18.5 LF-MCG/0.5 IM SUSP
0.5000 mL | Freq: Once | INTRAMUSCULAR | Status: AC
  Administered 2014-04-13: 0.5 mL via INTRAMUSCULAR
  Filled 2014-04-13: qty 0.5

## 2014-04-13 MED ORDER — ONDANSETRON HCL 4 MG PO TABS: 4.0000 mg | ORAL_TABLET | Freq: Four times a day (QID) | ORAL | Status: AC

## 2014-04-13 NOTE — ED Notes (Signed)
Pt requested wound care. Wound care completed.

## 2014-04-13 NOTE — ED Notes (Signed)
Family in room pt to ct

## 2014-04-13 NOTE — ED Provider Notes (Signed)
CSN: [id number]     Arrival date & time 04/13/14  1025 History   First MD Initiated Contact with Patient 04/13/14 1101     Chief Complaint  Patient presents with  . Head Injury     (Consider location/radiation/quality/duration/timing/severity/associated sxs/prior Treatment) Patient is a 63 y.o. female presenting with facial injury. The history is provided by the patient. No language interpreter was used.  Facial Injury Mechanism of injury:  Fall Associated symptoms: headaches   Associated symptoms: no nausea and no vomiting   Associated symptoms comment:  She was walking a dog when the animal started to chase another dog, causing her to fall onto cement with left facial and shoulder injury. There was a LOC of several minutes. No vomiting. No other injury. She denies neck pain, visual changes.   Past Medical History  Diagnosis Date  . Depression   . Hypertension    History reviewed. No pertinent past surgical history. No family history on file. History  Substance Use Topics  . Smoking status: Never Smoker   . Smokeless tobacco: Not on file  . Alcohol Use: Not on file   OB History   Grav Para Term Preterm Abortions TAB SAB Ect Mult Living                 Review of Systems  Constitutional: Negative for fever and chills.  Respiratory: Negative.  Negative for shortness of breath.   Cardiovascular: Negative.  Negative for chest pain.  Gastrointestinal: Negative.  Negative for nausea, vomiting and abdominal pain.  Musculoskeletal: Negative.        See HPI.  Skin: Positive for wound.  Neurological: Positive for syncope and headaches.      Allergies  Review of patient's allergies indicates no known allergies.  Home Medications   Prior to Admission medications   Medication Sig Start Date End Date Taking? Authorizing Provider  BIOTIN PO Take 1 tablet by mouth daily.   Yes Historical Provider, MD  buPROPion (WELLBUTRIN XL) 300 MG 24 hr tablet Take 300 mg by mouth daily.    Yes Historical Provider, MD  diclofenac (VOLTAREN) 50 MG EC tablet Take 50 mg by mouth 2 (two) times daily.   Yes Historical Provider, MD  Docusate Calcium (STOOL SOFTENER PO) Take 1 tablet by mouth every other day.   Yes Historical Provider, MD  ibuprofen (ADVIL,MOTRIN) 200 MG tablet Take 600 mg by mouth every 6 (six) hours as needed for headache (pain).   Yes Historical Provider, MD  venlafaxine XR (EFFEXOR-XR) 75 MG 24 hr capsule Take 75 mg by mouth daily with breakfast.   Yes Historical Provider, MD  Wheat Dextrin (BENEFIBER PO) Take 1 tablet by mouth every other day.   Yes Historical Provider, MD   BP 132/78  Pulse 71  Temp(Src) 99.2 F (37.3 C) (Oral)  Resp 13  SpO2 96% Physical Exam  Constitutional: She is oriented to person, place, and time. She appears well-developed and well-nourished.  HENT:  Head: Normocephalic.  Eyes: Pupils are equal, round, and reactive to light.  Neck: Normal range of motion. Neck supple.  Cardiovascular: Normal rate and regular rhythm.   Pulmonary/Chest: Effort normal and breath sounds normal. She exhibits no tenderness.  Abdominal: Soft. Bowel sounds are normal. There is no tenderness. There is no rebound and no guarding.  Musculoskeletal: Normal range of motion.  No bony deformities of the left shoulder. There is moderate swelling over AC region. No AC drop off, scapular tenderness of loss of range of  motion. No midline or paracervical tenderness.   Neurological: She is alert and oriented to person, place, and time. She has normal strength and normal reflexes. No sensory deficit. She displays a negative Romberg sign. Coordination normal.  Speech clear and focused. CN's 3-12 grossly intact. She partially amnestic to events of fall.  Skin: Skin is warm and dry. No rash noted.  Psychiatric: She has a normal mood and affect.    ED Course  Procedures (including critical care time) Labs Review Labs Reviewed - No data to display  Imaging Review Ct Head  Wo Contrast  04/13/2014   CLINICAL DATA:  Status post fall now with hematoma over the left cheek and mental status change  EXAM: CT HEAD WITHOUT CONTRAST  CT MAXILLOFACIAL WITHOUT CONTRAST  TECHNIQUE: Multidetector CT imaging of the head and maxillofacial structures were performed using the standard protocol without intravenous contrast. Multiplanar CT image reconstructions of the maxillofacial structures were also generated.  COMPARISON:  None.  FINDINGS: CT HEAD FINDINGS  There is mild age appropriate diffuse cerebral atrophy. There is no intracranial hemorrhage nor intracranial mass effect nor acute ischemic change. The cerebellum and brainstem exhibit no acute abnormalities. The observed paranasal sinuses and mastoid air cells are clear. There is no acute skull fracture.  CT MAXILLOFACIAL FINDINGS  There is soft tissue swelling over the left malar region. There is no acute fracture here. The orbital bones and the paranasal sinus walls are intact. There are no air-fluid levels. The nasal bones are intact. The pterygoid plates are normal. The temporomandibular joints and the mandible are intact. The soft tissues of the orbits correction of the orbits are intact. There is mild preseptal edema on the left.  IMPRESSION: 1. There is no acute intracranial hemorrhage nor other acute abnormality of the brain. There are mild age-appropriate atrophic changes. There is no acute skull fracture. 2. There is no acute facial bone fracture. There is a hematoma over the left malar region. The paranasal sinuses and orbital structures are intact.   Electronically Signed   By: David  [place]   On: 04/13/2014 12:24   Dg Shoulder Left  04/13/2014   CLINICAL DATA:  Left upper arm injury.  EXAM: LEFT SHOULDER - 2+ VIEW  COMPARISON:  None.  FINDINGS: No fracture or dislocation is noted. Visualized ribs appear normal. Moderate narrowing of glenohumeral joint is noted.  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 No acute abnormality seen in the left shoulder.   Electronically Signed   By: Roque Lias M.D.   On: 04/13/2014 12:06   Ct Maxillofacial Wo Cm  04/13/2014   CLINICAL DATA:  Status post fall now with hematoma over the left cheek and mental status change  EXAM: CT HEAD WITHOUT CONTRAST  CT MAXILLOFACIAL WITHOUT CONTRAST  TECHNIQUE: Multidetector CT imaging of the head and maxillofacial structures were performed using the standard protocol without intravenous contrast. Multiplanar CT image reconstructions of the maxillofacial structures were also generated.  COMPARISON:  None.  FINDINGS: CT HEAD FINDINGS  There is mild age appropriate diffuse cerebral atrophy. There is no intracranial hemorrhage nor intracranial mass effect nor acute ischemic change. The cerebellum and brainstem exhibit no acute abnormalities. The observed paranasal sinuses and mastoid air cells are clear. There is no acute skull fracture.  CT MAXILLOFACIAL FINDINGS  There is soft tissue swelling over the left malar region. There is no acute fracture here. The orbital bones and the paranasal sinus walls are intact. There are no  air-fluid levels. The nasal bones are intact. The pterygoid plates are normal. The temporomandibular joints and the mandible are intact. The soft tissues of the orbits correction of the orbits are intact. There is mild preseptal edema on the left.  IMPRESSION: 1. There is no acute intracranial hemorrhage nor other acute abnormality of the brain. There are mild age-appropriate atrophic changes. There is no acute skull fracture. 2. There is no acute facial bone fracture. There is a hematoma over the left malar region. The paranasal sinuses and orbital structures are intact.   Electronically Signed   By: David  [place]   On: 04/13/2014 12:24     EKG Interpretation None      MDM   Final diagnoses:  None    1. Facial contusion 2. Left shoulder abrasion  Negative Head and facial CT for IC injury  or fracture. She remains awake, alert and neurologically intact without change. Head injury precautions given. Stable for discharge.     Arnoldo Hooker, PA-C 04/13/14 1259

## 2014-04-13 NOTE — ED Notes (Signed)
Was walking her sisters dog german shepard she fell and bumped her head repetitive questions and she does not remember anythinghas hematoma to left cheek and shouder area and left upper arm  And fingers

## 2014-04-13 NOTE — Discharge Instructions (Signed)
Cryotherapy °Cryotherapy means treatment with cold. Ice or gel packs can be used to reduce both pain and swelling. Ice is the most helpful within the first 24 to 48 hours after an injury or flare-up from overusing a muscle or joint. Sprains, strains, spasms, burning pain, shooting pain, and aches can all be eased with ice. Ice can also be used when recovering from surgery. Ice is effective, has very few side effects, and is safe for most people to use. °PRECAUTIONS  °Ice is not a safe treatment option for people with: °· Raynaud phenomenon. This is a condition affecting small blood vessels in the extremities. Exposure to cold may cause your problems to return. °· Cold hypersensitivity. There are many forms of cold hypersensitivity, including: °¨ Cold urticaria. Red, itchy hives appear on the skin when the tissues begin to warm after being iced. °¨ Cold erythema. This is a red, itchy rash caused by exposure to cold. °¨ Cold hemoglobinuria. Red blood cells break down when the tissues begin to warm after being iced. The hemoglobin that carry oxygen are passed into the urine because they cannot combine with blood proteins fast enough. °· Numbness or altered sensitivity in the area being iced. °If you have any of the following conditions, do not use ice until you have discussed cryotherapy with your caregiver: °· Heart conditions, such as arrhythmia, angina, or chronic heart disease. °· High blood pressure. °· Healing wounds or open skin in the area being iced. °· Current infections. °· Rheumatoid arthritis. °· Poor circulation. °· Diabetes. °Ice slows the blood flow in the region it is applied. This is beneficial when trying to stop inflamed tissues from spreading irritating chemicals to surrounding tissues. However, if you expose your skin to cold temperatures for too long or without the proper protection, you can damage your skin or nerves. Watch for signs of skin damage due to cold. °HOME CARE INSTRUCTIONS °Follow  these tips to use ice and cold packs safely. °· Place a dry or damp towel between the ice and skin. A damp towel will cool the skin more quickly, so you may need to shorten the time that the ice is used. °· For a more rapid response, add gentle compression to the ice. °· Ice for no more than 10 to 20 minutes at a time. The bonier the area you are icing, the less time it will take to get the benefits of ice. °· Check your skin after 5 minutes to make sure there are no signs of a poor response to cold or skin damage. °· Rest 20 minutes or more between uses. °· Once your skin is numb, you can end your treatment. You can test numbness by very lightly touching your skin. The touch should be so light that you do not see the skin dimple from the pressure of your fingertip. When using ice, most people will feel these normal sensations in this order: cold, burning, aching, and numbness. °· Do not use ice on someone who cannot communicate their responses to pain, such as small children or people with dementia. °HOW TO MAKE AN ICE PACK °Ice packs are the most common way to use ice therapy. Other methods include ice massage, ice baths, and cryosprays. Muscle creams that cause a cold, tingly feeling do not offer the same benefits that ice offers and should not be used as a substitute unless recommended by your caregiver. °To make an ice pack, do one of the following: °· Place crushed ice or a   bag of frozen vegetables in a sealable plastic bag. Squeeze out the excess air. Place this bag inside another plastic bag. Slide the bag into a pillowcase or place a damp towel between your skin and the bag. °· Mix 3 parts water with 1 part rubbing alcohol. Freeze the mixture in a sealable plastic bag. When you remove the mixture from the freezer, it will be slushy. Squeeze out the excess air. Place this bag inside another plastic bag. Slide the bag into a pillowcase or place a damp towel between your skin and the bag. °SEEK MEDICAL CARE  IF: °· You develop white spots on your skin. This may give the skin a blotchy (mottled) appearance. °· Your skin turns blue or pale. °· Your skin becomes waxy or hard. °· Your swelling gets worse. °MAKE SURE YOU:  °· Understand these instructions. °· Will watch your condition. °· Will get help right away if you are not doing well or get worse. °Document Released: 04/30/2011 Document Revised: 01/18/2014 Document Reviewed: 04/30/2011 °ExitCare® Patient Information ©2015 [employer]. This information is not intended to replace advice given to you by your health care provider. Make sure you discuss any questions you have with your health care provider. °Abrasion °An abrasion is a cut or scrape of the skin. Abrasions do not extend through all layers of the skin and most heal within 10 days. It is important to care for your abrasion properly to prevent infection. °CAUSES  °Most abrasions are caused by falling on, or gliding across, the ground or other surface. When your skin rubs on something, the outer and inner layer of skin rubs off, causing an abrasion. °DIAGNOSIS  °Your caregiver will be able to diagnose an abrasion during a physical exam.  °TREATMENT  °Your treatment depends on how large and deep the abrasion is. Generally, your abrasion will be cleaned with water and a mild soap to remove any dirt or debris. An antibiotic ointment may be put over the abrasion to prevent an infection. A bandage (dressing) may be wrapped around the abrasion to keep it from getting dirty.  °You may need a tetanus shot if: °· You cannot remember when you had your last tetanus shot. °· You have never had a tetanus shot. °· The injury broke your skin. °If you get a tetanus shot, your arm may swell, get red, and feel warm to the touch. This is common and not a problem. If you need a tetanus shot and you choose not to have one, there is a rare chance of getting tetanus. Sickness from tetanus can be serious.  °HOME CARE INSTRUCTIONS   °· If a dressing was applied, change it at least once a day or as directed by your caregiver. If the bandage sticks, soak it off with warm water.   °· Wash the area with water and a mild soap to remove all the ointment 2 times a day. Rinse off the soap and pat the area dry with a clean towel.   °· Reapply any ointment as directed by your caregiver. This will help prevent infection and keep the bandage from sticking. Use gauze over the wound and under the dressing to help keep the bandage from sticking.   °· Change your dressing right away if it becomes wet or dirty.   °· Only take over-the-counter or prescription medicines for pain, discomfort, or fever as directed by your caregiver.   °· Follow up with your caregiver within 24-48 hours for a wound check, or as directed. If you were   not given a wound-check appointment, look closely at your abrasion for redness, swelling, or pus. These are signs of infection. °SEEK IMMEDIATE MEDICAL CARE IF:  °· You have increasing pain in the wound.   °· You have redness, swelling, or tenderness around the wound.   °· You have pus coming from the wound.   °· You have a fever or persistent symptoms for more than 2-3 days. °· You have a fever and your symptoms suddenly get worse. °· You have a bad smell coming from the wound or dressing.   °MAKE SURE YOU:  °· Understand these instructions. °· Will watch your condition. °· Will get help right away if you are not doing well or get worse. °Document Released: 06/13/2005 Document Revised: 08/20/2012 Document Reviewed: 08/07/2011 °ExitCare® Patient Information ©2015 [employer]. This information is not intended to replace advice given to you by your health care provider. Make sure you discuss any questions you have with your health care provider. °Facial or Scalp Contusion °A facial or scalp contusion is a deep bruise on the face or head. Injuries to the face and head generally cause a lot of swelling, especially around the eyes.  Contusions are the result of an injury that caused bleeding under the skin. The contusion may turn blue, purple, or yellow. Minor injuries will give you a painless contusion, but more severe contusions may stay painful and swollen for a few weeks.  °CAUSES  °A facial or scalp contusion is caused by a blunt injury or trauma to the face or head area.  °SIGNS AND SYMPTOMS  °· Swelling of the injured area.   °· Discoloration of the injured area.   °· Tenderness, soreness, or pain in the injured area.   °DIAGNOSIS  °The diagnosis can be made by taking a medical history and doing a physical exam. An X-ray exam, CT scan, or MRI may be needed to determine if there are any associated injuries, such as broken bones (fractures). °TREATMENT  °Often, the best treatment for a facial or scalp contusion is applying cold compresses to the injured area. Over-the-counter medicines may also be recommended for pain control.  °HOME CARE INSTRUCTIONS  °· Only take over-the-counter or prescription medicines as directed by your health care provider.   °· Apply ice to the injured area.   °· Put ice in a plastic bag.   °· Place a towel between your skin and the bag.   °· Leave the ice on for 20 minutes, 2-3 times a day.   °SEEK MEDICAL CARE IF: °· You have bite problems.   °· You have pain with chewing.   °· You are concerned about facial defects. °SEEK IMMEDIATE MEDICAL CARE IF: °· You have severe pain or a headache that is not relieved by medicine.   °· You have unusual sleepiness, confusion, or personality changes.   °· You throw up (vomit).   °· You have a persistent nosebleed.   °· You have double vision or blurred vision.   °· You have fluid drainage from your nose or ear.   °· You have difficulty walking or using your arms or legs.   °MAKE SURE YOU:  °· Understand these instructions. °· Will watch your condition. °· Will get help right away if you are not doing well or get worse. °Document Released: 10/11/2004 Document Revised:  06/24/2013 Document Reviewed: 04/16/2013 °ExitCare® Patient Information ©2015 [employer]. This information is not intended to replace advice given to you by your health care provider. Make sure you discuss any questions you have with your health care provider. ° °

## 2014-04-17 NOTE — ED Provider Notes (Signed)
Medical screening examination/treatment/procedure(s) were performed by non-physician practitioner and as supervising physician I was immediately available for consultation/collaboration.   EKG Interpretation None        Candyce ChurnJohn David Corban Kistler III, MD 04/17/14 1550

## 2014-04-20 MED ORDER — DICLOFENAC SODIUM 50 MG PO TBEC
50 MG | ORAL_TABLET | ORAL | Status: DC
Start: 2014-04-20 — End: 2014-05-19

## 2014-05-19 MED ORDER — DICLOFENAC SODIUM 50 MG PO TBEC
50 MG | ORAL_TABLET | ORAL | Status: DC
Start: 2014-05-19 — End: 2014-10-12

## 2014-10-07 ENCOUNTER — Inpatient Hospital Stay: Admit: 2014-10-07 | Attending: Internal Medicine | Primary: Internal Medicine

## 2014-10-07 ENCOUNTER — Encounter

## 2014-10-07 DIAGNOSIS — Z1231 Encounter for screening mammogram for malignant neoplasm of breast: Secondary | ICD-10-CM

## 2014-10-12 MED ORDER — DICLOFENAC SODIUM 50 MG PO TBEC
50 MG | ORAL_TABLET | ORAL | Status: DC
Start: 2014-10-12 — End: 2015-06-14

## 2014-11-30 ENCOUNTER — Encounter: Attending: Orthopaedic Surgery | Primary: Internal Medicine

## 2015-06-14 MED ORDER — DICLOFENAC SODIUM 50 MG PO TBEC
50 MG | ORAL_TABLET | ORAL | 2 refills | Status: DC
Start: 2015-06-14 — End: 2015-09-21

## 2015-07-17 IMAGING — CR DG SHOULDER 2+V*L*
3 series · 3 of 3 positions shown · non-contrast
Comparison: None.

CLINICAL DATA: Left upper arm injury.

EXAM:
LEFT SHOULDER - 2+ VIEW

[w shoulder ap internal left]
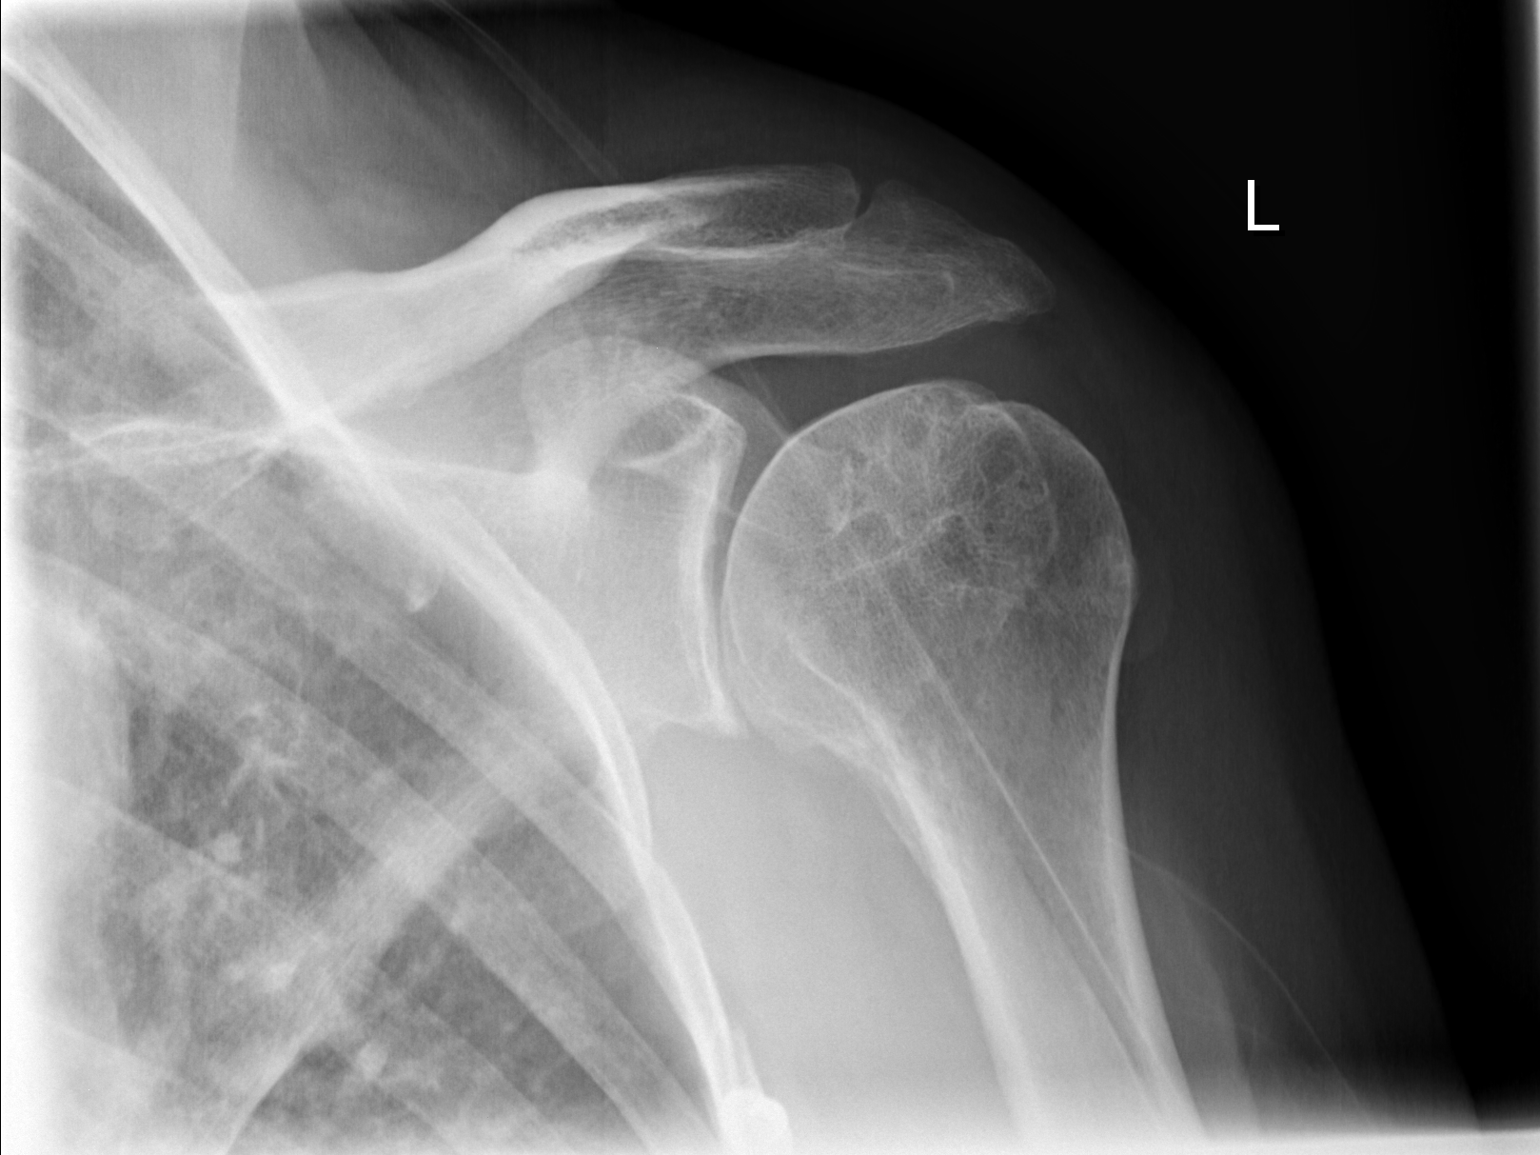

[w shoulder y view left]
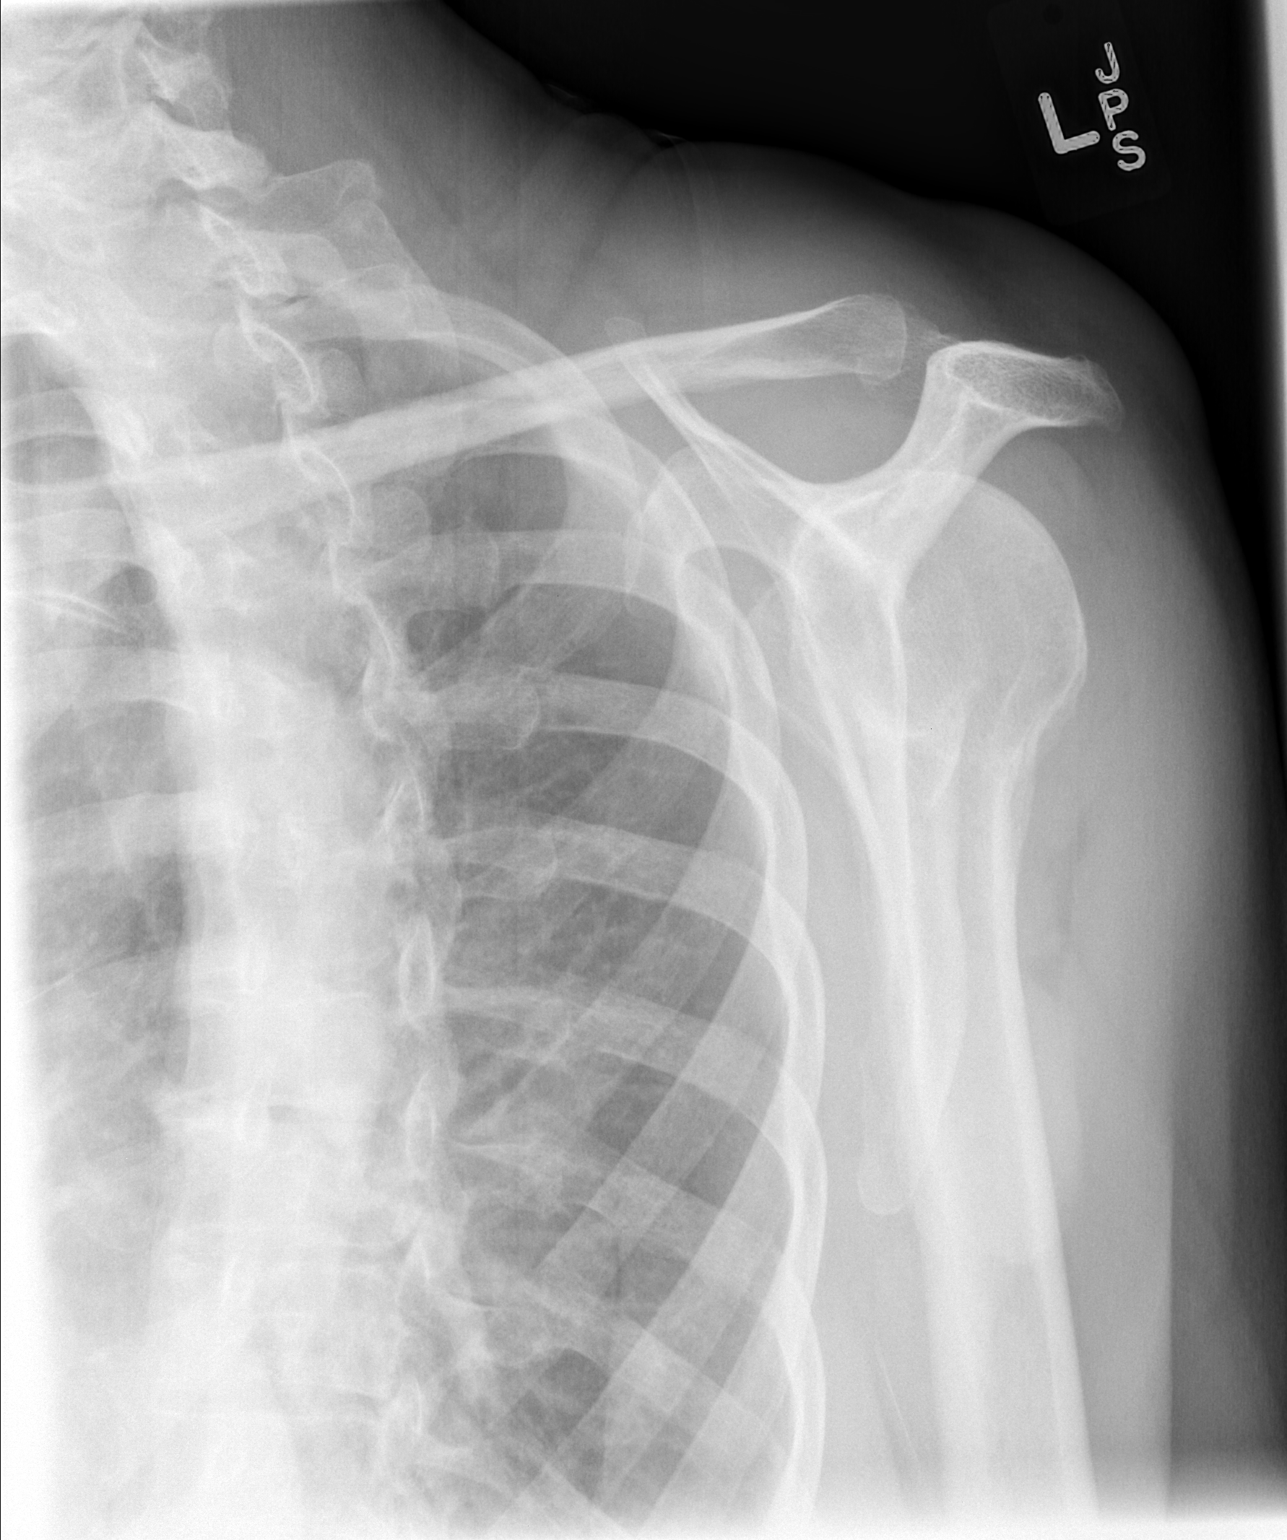

[x shoulder axillary left *]
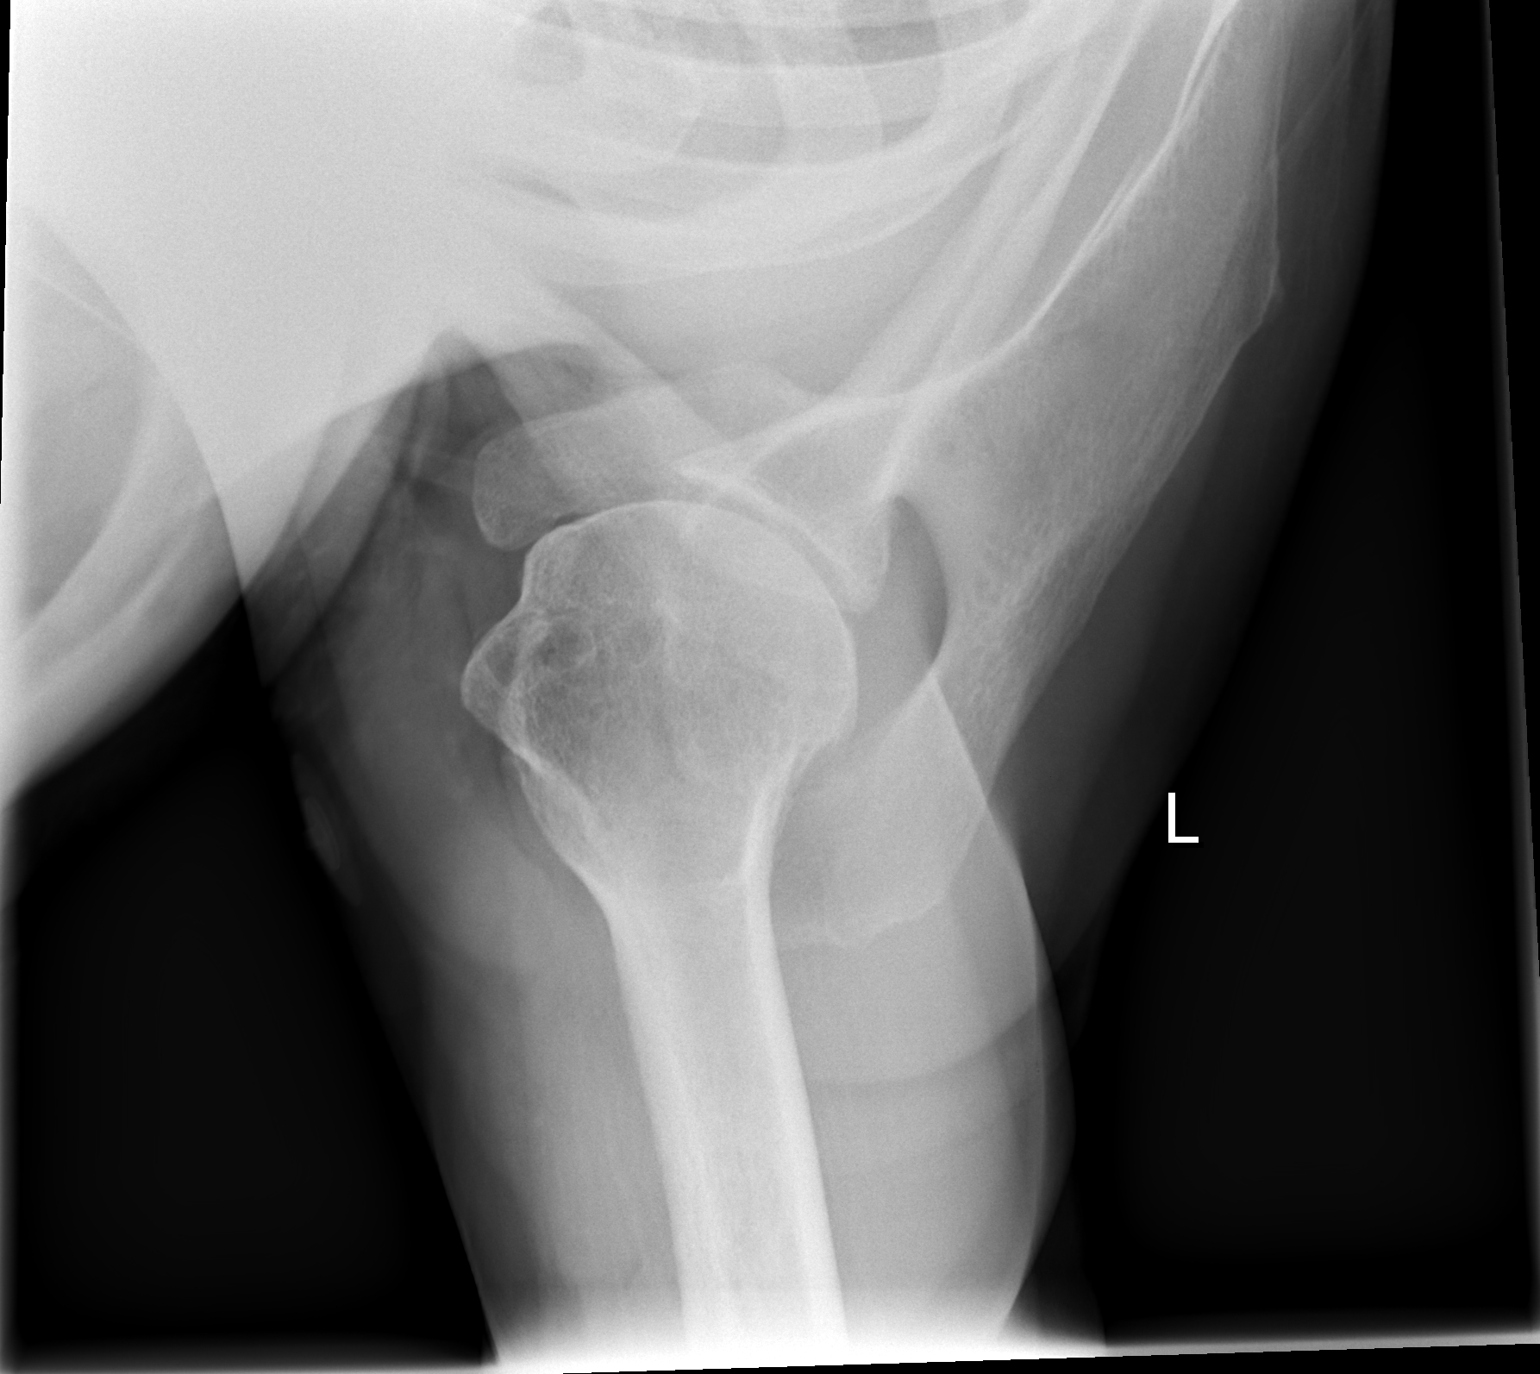

[3 of 3 positions shown; findings below may reference images not displayed]

FINDINGS: No fracture or dislocation is noted. Visualized ribs appear normal.
Moderate narrowing of glenohumeral joint is noted.
IMPRESSION: Moderate degenerative joint disease of the left glenohumeral joint.
No acute abnormality seen in the left shoulder.

## 2015-09-21 MED ORDER — DICLOFENAC SODIUM 50 MG PO TBEC
50 MG | ORAL_TABLET | ORAL | 1 refills | Status: AC
Start: 2015-09-21 — End: ?

## 2015-12-29 ENCOUNTER — Inpatient Hospital Stay: Admit: 2015-12-29 | Attending: Internal Medicine | Primary: Internal Medicine

## 2015-12-29 ENCOUNTER — Encounter

## 2015-12-29 DIAGNOSIS — Z1231 Encounter for screening mammogram for malignant neoplasm of breast: Secondary | ICD-10-CM

## 2016-03-29 MED ORDER — DICLOFENAC SODIUM 50 MG PO TBEC
50 MG | ORAL_TABLET | Freq: Two times a day (BID) | ORAL | 0 refills | Status: AC
Start: 2016-03-29 — End: ?

## 2016-03-29 NOTE — Telephone Encounter (Signed)
Patient is requesting a refill of diclofenac 50 mg, last filled 09/21/15  Patient asking for a 90 day supply please-states she cannot get filled from PCP because she is in the process of switching    Preferred pharmacy- [facility] [phone]  Patient can be reached at [phone]

## 2016-03-29 NOTE — Telephone Encounter (Signed)
Spoke with Darl PikesSusan, per SMA ok to refill, script sent to pharmacy and patient notified.

## 2016-12-31 ENCOUNTER — Encounter

## 2016-12-31 ENCOUNTER — Inpatient Hospital Stay: Admit: 2016-12-31 | Primary: Internal Medicine

## 2016-12-31 DIAGNOSIS — Z1231 Encounter for screening mammogram for malignant neoplasm of breast: Secondary | ICD-10-CM

## 2018-05-28 ENCOUNTER — Ambulatory Visit: Admit: 2018-05-28 | Discharge: 2018-06-09 | Payer: MEDICARE | Primary: Internal Medicine

## 2018-05-28 ENCOUNTER — Ambulatory Visit
Admit: 2018-05-28 | Discharge: 2018-05-28 | Payer: MEDICARE | Attending: Orthopaedic Surgery | Primary: Internal Medicine

## 2018-05-28 DIAGNOSIS — M25572 Pain in left ankle and joints of left foot: Secondary | ICD-10-CM

## 2018-05-28 DIAGNOSIS — M19072 Primary osteoarthritis, left ankle and foot: Secondary | ICD-10-CM

## 2018-06-03 DIAGNOSIS — M19072 Primary osteoarthritis, left ankle and foot: Secondary | ICD-10-CM

## 2018-06-03 NOTE — Progress Notes (Signed)
DIAGNOSIS:    1. 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   2. 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    3. Left subtalar arthritis.    DATE OF SURGERY:  11/30/2011.    HISTORY OF PRESENT ILLNESS:  Brooke Anderson 67 y.o. Caucasian female who came in today for evaluation of swelling and mild pain left lateral ankle and hindfoot with no trauma. She has been in regular shoes, and full WB.  No numbness or tingling sensation. No fever or Chills. She had LBP that resolved with Naprosyn.    Past Medical History:   Diagnosis Date   . ADD (attention deficit disorder with hyperactivity)    . Arthritis      RT SHOULDER/ VARIOUS JOINTS   . Depression    . Hypertension    . Thyroid disease     workup for nodule thyroid Ultrasound  wk 08/13/11       Past Surgical History:   Procedure Laterality Date   . EYE SURGERY  01/2010    eyelid bilaterally   . JOINT REPLACEMENT      LEFT ANKLE REPLACEMENT   . SHOULDER ARTHROPLASTY  08-27-2011    Right total shoulder arthroplsty, biceps tenodesis augmented steptek glenoid/depuy global AP       Social History     Socioeconomic History   . Marital status: Married     Spouse name: Not on file   . Number of children: 3   . Years of education: Not on file   . Highest education level: Not on file   Occupational History   . Not on file   Social Needs   . Financial resource strain: Not on file   . Food insecurity:     Worry: Not on file     Inability: Not on file   . Transportation needs:     Medical: Not on file     Non-medical: Not on file   Tobacco Use   . Smoking status: Never Smoker   . Smokeless tobacco: Never Used   Substance and Sexual Activity   . Alcohol use: Yes     Comment: weekends   . Drug use: No   . Sexual activity: Not on file   Lifestyle   . Physical activity:     Days per week: Not on file     Minutes per session: Not on file   . Stress: Not on file   Relationships    . Social connections:     Talks on phone: Not on file     Gets together: Not on file     Attends religious service: Not on file     Active member of club or organization: Not on file     Attends meetings of clubs or organizations: Not on file     Relationship status: Not on file   . Intimate partner violence:     Fear of current or ex partner: Not on file     Emotionally abused: Not on file     Physically abused: Not on file     Forced sexual activity: Not on file   Other Topics Concern   . Not on file   Social History Narrative   . Not on file       Family History   Problem Relation Age of Onset   . Cancer Mother  ovarian    . Other Mother         alzheimer -fell   . Arthritis Father    . Arthritis Sister    . Other Sister         auto immune disorder       Current Outpatient Medications on File Prior to Visit   Medication Sig Dispense Refill   . acyclovir (ZOVIRAX) 400 MG tablet Take 400 mg by mouth     . MYRBETRIQ 25 MG TB24      . venlafaxine (EFFEXOR XR) 150 MG XR capsule Take 150 mg by mouth daily.     . diclofenac (VOLTAREN) 50 MG EC tablet Take 1 tablet by mouth 2 times daily (with meals). 60 tablet 0   . buPROPion (WELLBUTRIN XL) 300 MG XL tablet every morning.     . Multiple Vitamin (MULTI-VITAMIN PO) Take 1 tablet by mouth daily.     . Acetaminophen (TYLENOL EX ST ARTHRITIS PAIN PO) Take 2 tablets by mouth daily as needed.     . diclofenac (VOLTAREN) 50 MG EC tablet Take 1 tablet by mouth 2 times daily (with meals) 180 tablet 0   . diclofenac (VOLTAREN) 50 MG EC tablet TAKE ONE TABLET BY MOUTH TWICE A DAY WITH MEALS 60 tablet 1   . diclofenac (VOLTAREN) 50 MG EC tablet Take 1 tablet by mouth 2 times daily (with meals) 60 tablet 0   . naproxen (NAPROSYN) 500 MG tablet TAKE ONE TABLET BY MOUTH TWICE A DAY WITH MEALS 60 tablet 0   . naproxen sodium (ALEVE) 220 MG tablet Take 220 mg by mouth 2 times daily (with meals).    Indications: 2 TABLETS IN AM AND 1 EVENING PRN     . lisinopril  (PRINIVIL;ZESTRIL) 20 MG tablet Take 20 mg by mouth daily.       . Amphetamine-Dextroamphetamine (ADDERALL PO) Take 20 mg by mouth daily. Indications: CURRENTLY OUT OF MEDICATION       No current facility-administered medications on file prior to visit.        Pertinent items are noted in HPI  Review of systems reviewed from Patient History Form dated on 05/28/2018 and available in the patient's chart under the Media tab. No change noted.        PHYSICAL EXAMINATION:  Brooke Anderson is a very pleasant 67 y.o. Caucasian female who presents today in no acute distress, awake, alert, and oriented.  She is well dressed, nourished and  groomed.  Patient with normal affect.  Height is  5\' 9"  (1.753 m), weight is  , Body mass index is 27.91 kg/m.  Resting respiratory rate is 16.     The patient walks with no limp. The incision healed well lateral left ankle.  No signs of any erythema or drainage, mild swelling.  She has no pain with the active or passive range of motion of the left ankle, but decrease ROM.  She has intact sensation distally, and she is neurovascularly intact. She has mild tenderness over left sinus tarsi. Good strength, and no instability both lower extremities. Ankle reflex 1+ bilaterally.     IMAGING:  Three views left ankle showed anatomic alignment TAA, lateral plate and screws in good position, no loosening, no significant osteolysis. Syndesmosis area with good bone formation and healed well. Left subtalar arthritis.    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  2-  Left ankle synovitis.  3-  Left  subtalar arthritis.    PLAN: She will be WBAT, and continue aggressive ROM and peroneal strengthening exercise.  Voltaren PRN. We discussed the subtalar changes. The patient will come back for a follow up in 6 months at that time, we will take 3 views of the left ankle standing. We may consider left subtalar cortisone injection if indicated then.      Filbert Berthold, MD

## 2019-04-15 NOTE — Telephone Encounter (Signed)
Patient stated she has a brace custom made for her. She stated she saw another MD in [place] who was the individual who recommended the brace. She is asking that SMA fill out the letter of medical necessity for her insurance to cover it. I explained to her that SMA has not seen her in office since 2019 and SMA also did not recommend this brace either. Advised that the MD in [place] would need to sign off on this.

## 2019-04-15 NOTE — Telephone Encounter (Signed)
She has a question about getting a brace.  She can be reached at [phone]
